# Patient Record
Sex: Male | Born: 1965 | Race: White | Hispanic: Yes | Marital: Married | Smoking: Never smoker
Health system: Southern US, Community
[De-identification: ages and names within clinical notes are randomized; demographics above are authoritative.]

## PROBLEM LIST (undated history)

## (undated) HISTORY — PX: OTHER SURGICAL HISTORY: SHX169

---

## 2010-09-14 ENCOUNTER — Encounter: Admission: RE | Admit: 2010-09-14 | Discharge: 2010-09-14 | Payer: Self-pay | Admitting: Cardiology

## 2011-06-13 ENCOUNTER — Other Ambulatory Visit: Payer: Self-pay | Admitting: Family Medicine

## 2011-06-13 ENCOUNTER — Ambulatory Visit
Admission: RE | Admit: 2011-06-13 | Discharge: 2011-06-13 | Disposition: A | Discharge status: Home / Self Care | Payer: BC Managed Care – PPO | Source: Ambulatory Visit | Attending: Family Medicine | Admitting: Family Medicine

## 2011-06-13 DIAGNOSIS — R0789 Other chest pain: Secondary | ICD-10-CM

## 2011-06-26 ENCOUNTER — Other Ambulatory Visit: Payer: Self-pay | Admitting: Nephrology

## 2011-06-26 DIAGNOSIS — N179 Acute kidney failure, unspecified: Secondary | ICD-10-CM

## 2011-06-27 ENCOUNTER — Ambulatory Visit
Admission: RE | Admit: 2011-06-27 | Discharge: 2011-06-27 | Disposition: A | Discharge status: Home / Self Care | Payer: BC Managed Care – PPO | Source: Ambulatory Visit | Attending: Nephrology | Admitting: Nephrology

## 2011-06-27 DIAGNOSIS — N179 Acute kidney failure, unspecified: Secondary | ICD-10-CM

## 2013-08-08 ENCOUNTER — Ambulatory Visit (INDEPENDENT_AMBULATORY_CARE_PROVIDER_SITE_OTHER): Payer: BC Managed Care – PPO | Admitting: Internal Medicine

## 2013-08-08 DIAGNOSIS — Z789 Other specified health status: Secondary | ICD-10-CM

## 2013-08-08 DIAGNOSIS — Z23 Encounter for immunization: Secondary | ICD-10-CM

## 2013-08-08 MED ORDER — CIPROFLOXACIN HCL 500 MG PO TABS: 500.0000 mg | ORAL_TABLET | Freq: Two times a day (BID) | ORAL | Status: DC

## 2013-08-08 MED ORDER — AZITHROMYCIN 500 MG PO TABS: 500.0000 mg | ORAL_TABLET | Freq: Every day | ORAL | Status: DC

## 2013-09-22 NOTE — Progress Notes (Signed)
   Subjective:    Patient ID: GRACIN MCPARTLAND, male    DOB: Mar 31, 1966, 47 y.o.   MRN: [id number]  HPI 47yo M traveling for business through [place], then onto [place]. No plans to going to rural region, only staying  In the cities.  all - nkma pmhx - none   Review of Systems     Objective:   Physical Exam        Assessment & Plan:  Pre-travel vaccination = will provide typhoid, tdap, and hep A  Travel diarrhea = gave tips sheet, and antibiotics if needed; gave rx for cipro and azithromycin with instruction to take medicaiton

## 2014-02-06 ENCOUNTER — Ambulatory Visit: Payer: BC Managed Care – PPO

## 2014-02-06 ENCOUNTER — Ambulatory Visit (INDEPENDENT_AMBULATORY_CARE_PROVIDER_SITE_OTHER): Payer: BC Managed Care – PPO | Admitting: Internal Medicine

## 2014-02-06 DIAGNOSIS — Z7184 Encounter for health counseling related to travel: Secondary | ICD-10-CM

## 2014-02-06 DIAGNOSIS — Z23 Encounter for immunization: Secondary | ICD-10-CM

## 2014-02-06 DIAGNOSIS — Z7189 Other specified counseling: Secondary | ICD-10-CM

## 2014-02-06 NOTE — Progress Notes (Signed)
  Subjective:    Corey SkainsRaymond E Hoskinson is a 48 y.o. male who presents to the Infectious Disease clinic for travel consultation. 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 none      Plan:    Issues discussed: environmental concerns, future shots, insect-borne illnesses, jet lag, malaria, motion sickness, MVA safety, rabies, safe food/water, traveler's diarrhea, website/handouts for more information, what to do if ill upon return, what to do if ill while there and Yellow Fever. Immunizations recommended: Hepatitis A series and none others indicated since he will not be leaving hotel. Malaria prophylaxis: not indicated Traveler's diarrhea prophylaxis: ciprofloxacin. Total duration of visit: 45 Minutes. Total time spent on education, counseling, coordination of care: 30 Minutes.

## 2016-02-23 DIAGNOSIS — M7541 Impingement syndrome of right shoulder: Secondary | ICD-10-CM | POA: Diagnosis not present

## 2016-03-20 DIAGNOSIS — Z905 Acquired absence of kidney: Secondary | ICD-10-CM | POA: Diagnosis not present

## 2016-03-31 DIAGNOSIS — Z905 Acquired absence of kidney: Secondary | ICD-10-CM | POA: Diagnosis not present

## 2016-03-31 DIAGNOSIS — R944 Abnormal results of kidney function studies: Secondary | ICD-10-CM | POA: Diagnosis not present

## 2016-05-17 DIAGNOSIS — H18603 Keratoconus, unspecified, bilateral: Secondary | ICD-10-CM | POA: Diagnosis not present

## 2016-08-04 DIAGNOSIS — S86112A Strain of other muscle(s) and tendon(s) of posterior muscle group at lower leg level, left leg, initial encounter: Secondary | ICD-10-CM | POA: Diagnosis not present

## 2016-08-07 DIAGNOSIS — Z23 Encounter for immunization: Secondary | ICD-10-CM | POA: Diagnosis not present

## 2016-10-25 DIAGNOSIS — N5201 Erectile dysfunction due to arterial insufficiency: Secondary | ICD-10-CM | POA: Diagnosis not present

## 2016-10-25 DIAGNOSIS — Z125 Encounter for screening for malignant neoplasm of prostate: Secondary | ICD-10-CM | POA: Diagnosis not present

## 2016-11-09 DIAGNOSIS — R300 Dysuria: Secondary | ICD-10-CM | POA: Diagnosis not present

## 2016-11-27 DIAGNOSIS — Z125 Encounter for screening for malignant neoplasm of prostate: Secondary | ICD-10-CM | POA: Diagnosis not present

## 2017-02-08 DIAGNOSIS — M7541 Impingement syndrome of right shoulder: Secondary | ICD-10-CM | POA: Diagnosis not present

## 2017-05-02 DIAGNOSIS — Z905 Acquired absence of kidney: Secondary | ICD-10-CM | POA: Diagnosis not present

## 2017-05-02 DIAGNOSIS — R51 Headache: Secondary | ICD-10-CM | POA: Diagnosis not present

## 2017-05-02 DIAGNOSIS — R9431 Abnormal electrocardiogram [ECG] [EKG]: Secondary | ICD-10-CM | POA: Diagnosis not present

## 2017-05-08 ENCOUNTER — Encounter (INDEPENDENT_AMBULATORY_CARE_PROVIDER_SITE_OTHER): Payer: Self-pay

## 2017-05-08 ENCOUNTER — Ambulatory Visit (INDEPENDENT_AMBULATORY_CARE_PROVIDER_SITE_OTHER): Payer: BLUE CROSS/BLUE SHIELD | Admitting: Diagnostic Neuroimaging

## 2017-05-08 ENCOUNTER — Encounter: Payer: Self-pay | Admitting: Diagnostic Neuroimaging

## 2017-05-08 VITALS — BP 115/75 | HR 61 | Ht 69.0 in | Wt 172.6 lb

## 2017-05-08 DIAGNOSIS — R51 Headache: Secondary | ICD-10-CM | POA: Diagnosis not present

## 2017-05-08 DIAGNOSIS — R519 Headache, unspecified: Secondary | ICD-10-CM

## 2017-05-08 NOTE — Progress Notes (Signed)
[facility]  PATIENT: Joseph Rush DOB: [birth year]  REFERRING CLINICIAN: Christen Butter HISTORY FROM: patient  REASON FOR VISIT: new consult    HISTORICAL  CHIEF COMPLAINT:  Chief Complaint  Patient presents with  . NP [place]  . Headaches    for 2 yrs, bil frontal/ temporal,  Lying down causes majority. Foggy/ forgetful.  When  exercising no headahces    HISTORY OF PRESENT ILLNESS:   51 year old male here for evaluation of headaches. 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  2 years ago patient started to have intermittent headaches, lasting 50 minutes at a time, one time per week, with dull bandlike sensation across his forehead and bitemporal region. No nausea or vomiting. No photophobia or phonophobia. No dizziness, numbness or tingling. Headaches could be 5 out of 10 up to 10 out of 10 severity. Over the past 6 months headaches have increasingly worsened. Now they are occurring more frequently and with more intensity. He notes that headaches tend to be worse when he lays down at nighttime when he is trying to relax and go to sleep. He does not notice headaches as much when he is more active. He participates in vigorous physical activity including CrossFit 2 times per week and does not have any problems with headache during this time. 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 Patient reports recent headache during July 4 weekend when he was on a vacation trip at the beach.  Patient denies any significant stress or sleep problems.  Patient is very concerned that "something is wrong" and he would like to have testing done to figure out what is going on.  Patient takes one cup of coffee per day. He drinks 2 alcoholic drink per month.  Patient denies any fevers, chills, weight change, chest pain, shortness  of breath.   REVIEW OF SYSTEMS: Full 14 system review of systems performed and negative with exception of: Headache. Otherwise negative.  ALLERGIES: Allergies  Allergen Reactions  . Penicillins     HOME MEDICATIONS: Outpatient Medications Prior to Visit  Medication Sig Dispense Refill  . azithromycin (ZITHROMAX) 500 MG tablet Take 1 tablet (500 mg total) by mouth daily. If needed for 3 loose stools/24hr. Can stop taking if diarrhea resolves 5 tablet 0  . ciprofloxacin (CIPRO) 500 MG tablet Take 1 tablet (500 mg total) by mouth 2 (two) times daily. Take if needed for 3 loose stools/ 24 hrs. Can stop taking if diarrhea resolves 10 tablet 0   No facility-administered medications prior to visit.     PAST MEDICAL HISTORY: History reviewed. No pertinent past medical history.  PAST SURGICAL HISTORY: Past Surgical History:  Procedure Laterality Date  . kidney removed  [age] yr age   unsure of which one    FAMILY HISTORY: Family History  Problem Relation Age of Onset  . Lymphoma Mother     SOCIAL HISTORY:  Social History   Social History  . Marital status: Married    Spouse name: N/A  . Number of children: N/A  . Years of education: N/A   Occupational History  . Not on file.   Social History Main Topics  . Smoking status: Never Smoker  . Smokeless tobacco: Never Used  . Alcohol use Yes     Comment: 2 per month  . Drug use: No  .  Sexual activity: Not on file   Other Topics Concern  . Not on file   Social History Narrative   Lives at home with wife, 3 children.  Works [employer].  Has [employer] and [employer].     PHYSICAL EXAM  GENERAL EXAM/CONSTITUTIONAL: Vitals:  Vitals:   05/08/17 1555  BP: 115/75  Pulse: 61  Weight: 172 lb 9.6 oz (78.3 kg)  Height: 5' 9"  (1.753 m)     Body mass index is 25.49 kg/m.  No exam data present  Patient is in no distress; well developed, nourished and groomed; neck is supple  CARDIOVASCULAR:  Examination of carotid  arteries is normal; no carotid bruits  Regular rate and rhythm, no murmurs  Examination of peripheral vascular system by observation and palpation is normal  EYES:  Ophthalmoscopic exam of optic discs and posterior segments is normal; no papilledema or hemorrhages  MUSCULOSKELETAL:  Gait, strength, tone, movements noted in Neurologic exam below  NEUROLOGIC: MENTAL STATUS:  No flowsheet data found.  awake, alert, oriented to person, place and time  recent and remote memory intact  normal attention and concentration  language fluent, comprehension intact, naming intact,   fund of knowledge appropriate  CRANIAL NERVE:   2nd - no papilledema on fundoscopic exam  2nd, 3rd, 4th, 6th - pupils equal and reactive to light, visual fields full to confrontation, extraocular muscles intact, no nystagmus  5th - facial sensation symmetric  7th - facial strength symmetric  8th - hearing intact  9th - palate elevates symmetrically, uvula midline  11th - shoulder shrug symmetric  12th - tongue protrusion midline  MOTOR:   normal bulk and tone, full strength in the BUE, BLE  SENSORY:   normal and symmetric to light touch, temperature, vibration  COORDINATION:   finger-nose-finger, fine finger movements normal  REFLEXES:   deep tendon reflexes present and symmetric  GAIT/STATION:   narrow based gait; romberg is negative     DIAGNOSTIC DATA (LABS, IMAGING, TESTING) - I reviewed patient records, labs, notes, testing and imaging myself where available.  No results found for: WBC, HGB, HCT, MCV, PLT No results found for: NA, K, CL, CO2, GLUCOSE, BUN, CREATININE, CALCIUM, PROT, ALBUMIN, AST, ALT, ALKPHOS, BILITOT, GFRNONAA, GFRAA No results found for: CHOL, HDL, LDLCALC, LDLDIRECT, TRIG, CHOLHDL No results found for: HGBA1C No results found for: VITAMINB12 No results found for: TSH   06/13/11 CT chest  - No acute chest findings.  - Few sub centimeter pulmonary  nodules.  Largest nodule measures 4 mm.  Findings could represent old granulomas disease but indeterminate.If the patient is at high risk for bronchogenic carcinoma, follow-up chest CT at 1 year is recommended.  If the patient is at low risk, no follow-up is needed.       ASSESSMENT AND PLAN  51 y.o. year old male here with new-onset headaches in 2016, worsening in the last 6 months, with dull bandlike sensation across his forehead and bitemporal region, without associated symptoms. Headaches are worse in supine position in the evening. Headaches have not affected his physical or coordination abilities. Patient does not feel headaches during exertion. Will proceed with further workup due to new onset and positional nature of headaches.   Dx: tension headache vs other secondary headache  1. New onset headache      PLAN: - check MRI brain; if negative, may consider lumbar puncture - check ESR / CRP - follow up with PCP for general medical workup  Orders Placed This Encounter  Procedures  . MR BRAIN WO CONTRAST  . Sedimentation Rate  . C-reactive Protein   Return in about 3 months (around 08/08/2017).    Penni Bombard, MD 2/95/2841, 3:24 PM Certified in Neurology, Neurophysiology and Neuroimaging  [facility] [address], [place], [place] [address] [phone]

## 2017-05-08 NOTE — Patient Instructions (Signed)

## 2017-05-09 ENCOUNTER — Telehealth: Payer: Self-pay | Admitting: Diagnostic Neuroimaging

## 2017-05-09 ENCOUNTER — Other Ambulatory Visit (INDEPENDENT_AMBULATORY_CARE_PROVIDER_SITE_OTHER): Payer: Self-pay

## 2017-05-09 DIAGNOSIS — R51 Headache: Secondary | ICD-10-CM | POA: Diagnosis not present

## 2017-05-09 DIAGNOSIS — Z0289 Encounter for other administrative examinations: Secondary | ICD-10-CM

## 2017-05-09 NOTE — Telephone Encounter (Signed)
Noted. Will await MRI results. -VRP

## 2017-05-09 NOTE — Telephone Encounter (Signed)
Patient came for labs this morning, he wanted Dr. Marjory LiesPenumalli to know that he has travelled extensively and 2 years ago he was in the [place] and that is when the headaches started. Best call back [phone]

## 2017-05-09 NOTE — Telephone Encounter (Signed)
Spoke to pt and let him know that Dr. Marjory LiesPenumalli received information and will await MRI results.  He verbalized understanding

## 2017-05-10 LAB — SEDIMENTATION RATE: SED RATE: 2 mm/h (ref 0–30)

## 2017-05-10 LAB — C-REACTIVE PROTEIN: CRP: 0.5 mg/L (ref 0.0–4.9)

## 2017-05-18 DIAGNOSIS — Z905 Acquired absence of kidney: Secondary | ICD-10-CM | POA: Diagnosis not present

## 2017-05-18 DIAGNOSIS — R944 Abnormal results of kidney function studies: Secondary | ICD-10-CM | POA: Diagnosis not present

## 2017-05-18 NOTE — Telephone Encounter (Signed)
LVM for pt to call to r/s appt on 10/30-provider will not be in the office. Pt returned the call. In talking with the patient he feels Dr Demetrius CharityP is going to want to see him sooner than 3 mths to discuss the MRI results. I advised he would receive a phone call but he wanting an appt soon. Patient is adamant is needs a sooner appt.

## 2017-05-18 NOTE — Telephone Encounter (Signed)
Per RN pt needs to make appt for 3 mths out and put on wait list. LVM for pt to call

## 2017-05-21 DIAGNOSIS — R1032 Left lower quadrant pain: Secondary | ICD-10-CM | POA: Diagnosis not present

## 2017-05-21 DIAGNOSIS — Z1211 Encounter for screening for malignant neoplasm of colon: Secondary | ICD-10-CM | POA: Diagnosis not present

## 2017-05-21 NOTE — Telephone Encounter (Signed)
I called pt and he is scheduled for 09-12-17 with Dr. Marjory LiesPenumalli, and pending results of MRI may move up.  Pt aware and is to have MRI tomorrow.

## 2017-05-22 ENCOUNTER — Ambulatory Visit
Admission: RE | Admit: 2017-05-22 | Discharge: 2017-05-22 | Disposition: A | Discharge status: Home / Self Care | Payer: BLUE CROSS/BLUE SHIELD | Source: Ambulatory Visit | Attending: Diagnostic Neuroimaging | Admitting: Diagnostic Neuroimaging

## 2017-05-22 DIAGNOSIS — R519 Headache, unspecified: Secondary | ICD-10-CM

## 2017-05-22 DIAGNOSIS — R51 Headache: Secondary | ICD-10-CM | POA: Diagnosis not present

## 2017-05-24 ENCOUNTER — Telehealth: Payer: Self-pay | Admitting: *Deleted

## 2017-05-24 DIAGNOSIS — H43393 Other vitreous opacities, bilateral: Secondary | ICD-10-CM | POA: Diagnosis not present

## 2017-05-24 DIAGNOSIS — H18613 Keratoconus, stable, bilateral: Secondary | ICD-10-CM | POA: Diagnosis not present

## 2017-05-24 NOTE — Telephone Encounter (Signed)
Spoke with patient and informed him his MRI brain is normal. He verbalized understanding, appreciation.

## 2017-05-31 DIAGNOSIS — Z1211 Encounter for screening for malignant neoplasm of colon: Secondary | ICD-10-CM | POA: Diagnosis not present

## 2017-05-31 DIAGNOSIS — K635 Polyp of colon: Secondary | ICD-10-CM | POA: Diagnosis not present

## 2017-05-31 DIAGNOSIS — D123 Benign neoplasm of transverse colon: Secondary | ICD-10-CM | POA: Diagnosis not present

## 2017-05-31 DIAGNOSIS — D122 Benign neoplasm of ascending colon: Secondary | ICD-10-CM | POA: Diagnosis not present

## 2017-06-19 ENCOUNTER — Ambulatory Visit: Payer: BLUE CROSS/BLUE SHIELD | Admitting: Diagnostic Neuroimaging

## 2017-07-30 ENCOUNTER — Encounter: Payer: Self-pay | Admitting: Diagnostic Neuroimaging

## 2017-08-14 ENCOUNTER — Ambulatory Visit: Payer: BLUE CROSS/BLUE SHIELD | Admitting: Diagnostic Neuroimaging

## 2017-08-14 DIAGNOSIS — Z23 Encounter for immunization: Secondary | ICD-10-CM | POA: Diagnosis not present

## 2017-09-12 ENCOUNTER — Ambulatory Visit: Payer: BLUE CROSS/BLUE SHIELD | Admitting: Diagnostic Neuroimaging

## 2017-11-01 DIAGNOSIS — N5201 Erectile dysfunction due to arterial insufficiency: Secondary | ICD-10-CM | POA: Diagnosis not present

## 2017-12-28 DIAGNOSIS — R944 Abnormal results of kidney function studies: Secondary | ICD-10-CM | POA: Diagnosis not present

## 2017-12-28 DIAGNOSIS — Z905 Acquired absence of kidney: Secondary | ICD-10-CM | POA: Diagnosis not present

## 2018-02-18 DIAGNOSIS — M7541 Impingement syndrome of right shoulder: Secondary | ICD-10-CM | POA: Diagnosis not present

## 2018-05-08 DIAGNOSIS — M7541 Impingement syndrome of right shoulder: Secondary | ICD-10-CM | POA: Diagnosis not present

## 2018-05-13 DIAGNOSIS — M7541 Impingement syndrome of right shoulder: Secondary | ICD-10-CM | POA: Diagnosis not present

## 2018-05-20 DIAGNOSIS — M7541 Impingement syndrome of right shoulder: Secondary | ICD-10-CM | POA: Diagnosis not present

## 2018-06-18 DIAGNOSIS — H18613 Keratoconus, stable, bilateral: Secondary | ICD-10-CM | POA: Diagnosis not present

## 2018-06-18 DIAGNOSIS — H2513 Age-related nuclear cataract, bilateral: Secondary | ICD-10-CM | POA: Diagnosis not present

## 2018-06-26 DIAGNOSIS — F419 Anxiety disorder, unspecified: Secondary | ICD-10-CM | POA: Diagnosis not present

## 2018-06-26 DIAGNOSIS — Z630 Problems in relationship with spouse or partner: Secondary | ICD-10-CM | POA: Diagnosis not present

## 2018-07-02 DIAGNOSIS — M7541 Impingement syndrome of right shoulder: Secondary | ICD-10-CM | POA: Diagnosis not present

## 2018-07-04 DIAGNOSIS — F419 Anxiety disorder, unspecified: Secondary | ICD-10-CM | POA: Diagnosis not present

## 2018-07-04 DIAGNOSIS — Z630 Problems in relationship with spouse or partner: Secondary | ICD-10-CM | POA: Diagnosis not present

## 2018-08-28 ENCOUNTER — Observation Stay (HOSPITAL_COMMUNITY): Payer: BLUE CROSS/BLUE SHIELD | Admitting: Anesthesiology

## 2018-08-28 ENCOUNTER — Emergency Department (HOSPITAL_COMMUNITY): Payer: BLUE CROSS/BLUE SHIELD

## 2018-08-28 ENCOUNTER — Observation Stay (HOSPITAL_COMMUNITY): Payer: BLUE CROSS/BLUE SHIELD

## 2018-08-28 ENCOUNTER — Encounter (HOSPITAL_COMMUNITY): Payer: Self-pay | Admitting: Emergency Medicine

## 2018-08-28 ENCOUNTER — Observation Stay (HOSPITAL_COMMUNITY)
Admission: EM | Admit: 2018-08-28 | Discharge: 2018-08-29 | Disposition: A | Discharge status: Home / Self Care | Payer: BLUE CROSS/BLUE SHIELD | Attending: Urology | Admitting: Urology

## 2018-08-28 ENCOUNTER — Other Ambulatory Visit: Payer: Self-pay

## 2018-08-28 ENCOUNTER — Encounter (HOSPITAL_COMMUNITY)
Admission: EM | Disposition: A | Discharge status: Home / Self Care | Payer: Self-pay | Source: Home / Self Care | Attending: Emergency Medicine

## 2018-08-28 DIAGNOSIS — N189 Chronic kidney disease, unspecified: Secondary | ICD-10-CM | POA: Insufficient documentation

## 2018-08-28 DIAGNOSIS — Q6231 Congenital ureterocele, orthotopic: Secondary | ICD-10-CM | POA: Diagnosis not present

## 2018-08-28 DIAGNOSIS — N201 Calculus of ureter: Secondary | ICD-10-CM | POA: Diagnosis not present

## 2018-08-28 DIAGNOSIS — N139 Obstructive and reflux uropathy, unspecified: Secondary | ICD-10-CM

## 2018-08-28 DIAGNOSIS — Z79899 Other long term (current) drug therapy: Secondary | ICD-10-CM | POA: Diagnosis not present

## 2018-08-28 DIAGNOSIS — N1339 Other hydronephrosis: Secondary | ICD-10-CM | POA: Diagnosis not present

## 2018-08-28 DIAGNOSIS — N1330 Unspecified hydronephrosis: Secondary | ICD-10-CM | POA: Diagnosis not present

## 2018-08-28 DIAGNOSIS — R1012 Left upper quadrant pain: Secondary | ICD-10-CM | POA: Diagnosis not present

## 2018-08-28 DIAGNOSIS — N135 Crossing vessel and stricture of ureter without hydronephrosis: Secondary | ICD-10-CM | POA: Diagnosis present

## 2018-08-28 DIAGNOSIS — N131 Hydronephrosis with ureteral stricture, not elsewhere classified: Principal | ICD-10-CM | POA: Insufficient documentation

## 2018-08-28 DIAGNOSIS — N179 Acute kidney failure, unspecified: Secondary | ICD-10-CM | POA: Insufficient documentation

## 2018-08-28 DIAGNOSIS — N2889 Other specified disorders of kidney and ureter: Secondary | ICD-10-CM | POA: Diagnosis not present

## 2018-08-28 DIAGNOSIS — Z87440 Personal history of urinary (tract) infections: Secondary | ICD-10-CM | POA: Diagnosis not present

## 2018-08-28 DIAGNOSIS — Z880 Allergy status to penicillin: Secondary | ICD-10-CM | POA: Diagnosis not present

## 2018-08-28 DIAGNOSIS — Z905 Acquired absence of kidney: Secondary | ICD-10-CM | POA: Diagnosis not present

## 2018-08-28 HISTORY — PX: CYSTOSCOPY WITH RETROGRADE PYELOGRAM, URETEROSCOPY AND STENT PLACEMENT: SHX5789

## 2018-08-28 LAB — COMPREHENSIVE METABOLIC PANEL
ALBUMIN: 4.3 g/dL (ref 3.5–5.0)
ALK PHOS: 46 U/L (ref 38–126)
ALT: 23 U/L (ref 0–44)
AST: 17 U/L (ref 15–41)
Anion gap: 7 (ref 5–15)
BUN: 27 mg/dL — ABNORMAL HIGH (ref 6–20)
CO2: 28 mmol/L (ref 22–32)
CREATININE: 1.92 mg/dL — AB (ref 0.61–1.24)
Calcium: 9.1 mg/dL (ref 8.9–10.3)
Chloride: 106 mmol/L (ref 98–111)
GFR calc Af Amer: 45 mL/min — ABNORMAL LOW (ref >60)
GFR calc non Af Amer: 38 mL/min — ABNORMAL LOW (ref >60)
Glucose, Bld: 95 mg/dL (ref 70–99)
Potassium: 4.2 mmol/L (ref 3.5–5.1)
SODIUM: 141 mmol/L (ref 135–145)
Total Bilirubin: 1.1 mg/dL (ref 0.3–1.2)
Total Protein: 6.7 g/dL (ref 6.5–8.1)

## 2018-08-28 LAB — CBC WITH DIFFERENTIAL/PLATELET
ABS IMMATURE GRANULOCYTES: 0.02 10*3/uL (ref 0.00–0.07)
BASOS ABS: 0 10*3/uL (ref 0.0–0.1)
Basophils Relative: 1 %
Eosinophils Absolute: 0.3 10*3/uL (ref 0.0–0.5)
Eosinophils Relative: 4 %
HCT: 36.5 % — ABNORMAL LOW (ref 39.0–52.0)
HEMOGLOBIN: 12.3 g/dL — AB (ref 13.0–17.0)
IMMATURE GRANULOCYTES: 0 %
LYMPHS PCT: 22 %
Lymphs Abs: 1.4 10*3/uL (ref 0.7–4.0)
MCH: 30.1 pg (ref 26.0–34.0)
MCHC: 33.7 g/dL (ref 30.0–36.0)
MCV: 89.2 fL (ref 80.0–100.0)
MONO ABS: 0.6 10*3/uL (ref 0.1–1.0)
Monocytes Relative: 9 %
NEUTROS ABS: 4.3 10*3/uL (ref 1.7–7.7)
Neutrophils Relative %: 64 %
Platelets: 157 10*3/uL (ref 150–400)
RBC: 4.09 MIL/uL — ABNORMAL LOW (ref 4.22–5.81)
RDW: 12.6 % (ref 11.5–15.5)
WBC: 6.6 10*3/uL (ref 4.0–10.5)
nRBC: 0 % (ref 0.0–0.2)

## 2018-08-28 LAB — SURGICAL PCR SCREEN
MRSA, PCR: NEGATIVE
Staphylococcus aureus: POSITIVE — AB

## 2018-08-28 LAB — URINALYSIS, ROUTINE W REFLEX MICROSCOPIC
BACTERIA UA: NONE SEEN
BILIRUBIN URINE: NEGATIVE
Glucose, UA: NEGATIVE mg/dL
KETONES UR: NEGATIVE mg/dL
LEUKOCYTES UA: NEGATIVE
NITRITE: NEGATIVE
Protein, ur: NEGATIVE mg/dL
SPECIFIC GRAVITY, URINE: 1.003 — AB (ref 1.005–1.030)
pH: 5 (ref 5.0–8.0)

## 2018-08-28 LAB — CK: Total CK: 51 U/L (ref 49–397)

## 2018-08-28 LAB — LIPASE, BLOOD: LIPASE: 29 U/L (ref 11–51)

## 2018-08-28 LAB — POC OCCULT BLOOD, ED: FECAL OCCULT BLD: NEGATIVE

## 2018-08-28 SURGERY — CYSTOURETEROSCOPY, WITH RETROGRADE PYELOGRAM AND STENT INSERTION
Anesthesia: General | Site: Ureter | Laterality: Left

## 2018-08-28 MED ORDER — ONDANSETRON HCL 4 MG/2ML IJ SOLN
INTRAMUSCULAR | Status: AC
  Filled 2018-08-28: qty 2

## 2018-08-28 MED ORDER — ONDANSETRON HCL 4 MG/2ML IJ SOLN
INTRAMUSCULAR | Status: DC | PRN
  Administered 2018-08-28: 4 mg via INTRAVENOUS

## 2018-08-28 MED ORDER — HYDROCODONE-ACETAMINOPHEN 5-325 MG PO TABS: 1.0000 | ORAL_TABLET | ORAL | Status: DC | PRN

## 2018-08-28 MED ORDER — MIDAZOLAM HCL 2 MG/2ML IJ SOLN
INTRAMUSCULAR | Status: AC
  Filled 2018-08-28: qty 2

## 2018-08-28 MED ORDER — ACETAMINOPHEN 10 MG/ML IV SOLN: 1000.0000 mg | Freq: Once | INTRAVENOUS | Status: DC | PRN

## 2018-08-28 MED ORDER — FENTANYL CITRATE (PF) 100 MCG/2ML IJ SOLN
INTRAMUSCULAR | Status: AC
  Filled 2018-08-28: qty 2

## 2018-08-28 MED ORDER — HYDROMORPHONE HCL 1 MG/ML IJ SOLN: 0.2500 mg | INTRAMUSCULAR | Status: DC | PRN

## 2018-08-28 MED ORDER — ONDANSETRON HCL 4 MG/2ML IJ SOLN: 4.0000 mg | INTRAMUSCULAR | Status: DC | PRN

## 2018-08-28 MED ORDER — PROMETHAZINE HCL 25 MG/ML IJ SOLN: 6.2500 mg | INTRAMUSCULAR | Status: DC | PRN

## 2018-08-28 MED ORDER — PROPOFOL 10 MG/ML IV BOLUS
INTRAVENOUS | Status: DC | PRN
  Administered 2018-08-28: 180 mg via INTRAVENOUS

## 2018-08-28 MED ORDER — DIPHENHYDRAMINE HCL 50 MG/ML IJ SOLN: 12.5000 mg | Freq: Four times a day (QID) | INTRAMUSCULAR | Status: DC | PRN

## 2018-08-28 MED ORDER — SODIUM CHLORIDE 0.9 % IV SOLN
INTRAVENOUS | Status: DC | PRN
  Administered 2018-08-28: 20 mL

## 2018-08-28 MED ORDER — SODIUM CHLORIDE 0.9 % IR SOLN
Status: DC | PRN
  Administered 2018-08-28: 6000 mL

## 2018-08-28 MED ORDER — MIDAZOLAM HCL 5 MG/5ML IJ SOLN
INTRAMUSCULAR | Status: DC | PRN
  Administered 2018-08-28: 2 mg via INTRAVENOUS

## 2018-08-28 MED ORDER — PROPOFOL 10 MG/ML IV BOLUS
INTRAVENOUS | Status: AC
  Filled 2018-08-28: qty 20

## 2018-08-28 MED ORDER — DIPHENHYDRAMINE HCL 12.5 MG/5ML PO ELIX: 12.5000 mg | ORAL_SOLUTION | Freq: Four times a day (QID) | ORAL | Status: DC | PRN

## 2018-08-28 MED ORDER — MORPHINE SULFATE (PF) 2 MG/ML IV SOLN: 2.0000 mg | INTRAVENOUS | Status: DC | PRN

## 2018-08-28 MED ORDER — SODIUM CHLORIDE 0.9 % IV BOLUS
1000.0000 mL | Freq: Once | INTRAVENOUS | Status: AC
  Administered 2018-08-28: 1000 mL via INTRAVENOUS

## 2018-08-28 MED ORDER — FENTANYL CITRATE (PF) 100 MCG/2ML IJ SOLN
INTRAMUSCULAR | Status: DC | PRN
  Administered 2018-08-28 (×4): 50 ug via INTRAVENOUS

## 2018-08-28 MED ORDER — MEPERIDINE HCL 50 MG/ML IJ SOLN: 6.2500 mg | INTRAMUSCULAR | Status: DC | PRN

## 2018-08-28 MED ORDER — MUPIROCIN 2 % EX OINT
1.0000 "application " | TOPICAL_OINTMENT | Freq: Two times a day (BID) | CUTANEOUS | Status: DC
  Administered 2018-08-28: 1 via NASAL
  Filled 2018-08-28: qty 22

## 2018-08-28 MED ORDER — HYDROCODONE-ACETAMINOPHEN 7.5-325 MG PO TABS: 1.0000 | ORAL_TABLET | Freq: Once | ORAL | Status: DC | PRN

## 2018-08-28 MED ORDER — DEXAMETHASONE SODIUM PHOSPHATE 10 MG/ML IJ SOLN
INTRAMUSCULAR | Status: AC
  Filled 2018-08-28: qty 1

## 2018-08-28 MED ORDER — SODIUM CHLORIDE 0.9 % IV SOLN
INTRAVENOUS | Status: DC
  Administered 2018-08-28: 14:00:00 via INTRAVENOUS

## 2018-08-28 MED ORDER — DEXAMETHASONE SODIUM PHOSPHATE 10 MG/ML IJ SOLN
INTRAMUSCULAR | Status: DC | PRN
  Administered 2018-08-28: 10 mg via INTRAVENOUS

## 2018-08-28 MED ORDER — CIPROFLOXACIN IN D5W 200 MG/100ML IV SOLN
200.0000 mg | INTRAVENOUS | Status: AC
  Administered 2018-08-28: 200 mg via INTRAVENOUS
  Filled 2018-08-28 (×2): qty 100

## 2018-08-28 MED ORDER — OXYBUTYNIN CHLORIDE 5 MG PO TABS: 5.0000 mg | ORAL_TABLET | Freq: Three times a day (TID) | ORAL | Status: DC | PRN

## 2018-08-28 MED ORDER — LIDOCAINE 2% (20 MG/ML) 5 ML SYRINGE
INTRAMUSCULAR | Status: AC
  Filled 2018-08-28: qty 5

## 2018-08-28 MED ORDER — LIDOCAINE HCL (CARDIAC) PF 100 MG/5ML IV SOSY
PREFILLED_SYRINGE | INTRAVENOUS | Status: DC | PRN
  Administered 2018-08-28: 100 mg via INTRAVENOUS

## 2018-08-28 MED ORDER — ACETAMINOPHEN 325 MG PO TABS: 650.0000 mg | ORAL_TABLET | ORAL | Status: DC | PRN

## 2018-08-28 MED ORDER — LACTATED RINGERS IV SOLN
INTRAVENOUS | Status: DC
  Administered 2018-08-28 (×2): via INTRAVENOUS

## 2018-08-28 MED ORDER — IOHEXOL 300 MG/ML  SOLN
30.0000 mL | Freq: Once | INTRAMUSCULAR | Status: AC | PRN
  Administered 2018-08-28: 30 mL via ORAL

## 2018-08-28 SURGICAL SUPPLY — 21 items
BAG URO CATCHER STRL LF (MISCELLANEOUS) ×2 IMPLANT
BASKET ZERO TIP NITINOL 2.4FR (BASKET) IMPLANT
CATH URET 5FR 28IN OPEN ENDED (CATHETERS) IMPLANT
CLOTH BEACON ORANGE TIMEOUT ST (SAFETY) ×2 IMPLANT
COVER WAND RF STERILE (DRAPES) IMPLANT
EXTRACTOR STONE NITINOL NGAGE (UROLOGICAL SUPPLIES) IMPLANT
FIBER LASER FLEXIVA 365 (UROLOGICAL SUPPLIES) IMPLANT
FIBER LASER TRAC TIP (UROLOGICAL SUPPLIES) IMPLANT
GLOVE BIOGEL M STRL SZ7.5 (GLOVE) ×2 IMPLANT
GOWN STRL REUS W/TWL XL LVL3 (GOWN DISPOSABLE) ×2 IMPLANT
GUIDEWIRE ANG ZIPWIRE 038X150 (WIRE) ×2 IMPLANT
GUIDEWIRE STR DUAL SENSOR (WIRE) IMPLANT
GUIDEWIRE ZIPWRE .038 STRAIGHT (WIRE) ×2 IMPLANT
KNIFE COLLINS 24FR (ELECTROSURGICAL) ×2 IMPLANT
MANIFOLD NEPTUNE II (INSTRUMENTS) ×2 IMPLANT
PACK CYSTO (CUSTOM PROCEDURE TRAY) ×2 IMPLANT
SHEATH URETERAL 12FRX35CM (MISCELLANEOUS) IMPLANT
STENT CONTOUR 6FRX26X.038 (STENTS) IMPLANT
STENT URET 6FRX26 CONTOUR (STENTS) ×2 IMPLANT
TUBING CONNECTING 10 (TUBING) ×2 IMPLANT
TUBING UROLOGY SET (TUBING) ×2 IMPLANT

## 2018-08-28 NOTE — Anesthesia Procedure Notes (Signed)
Date/Time: 08/28/2018 6:03 PM Performed by: Minerva EndsMirarchi, Joya Willmott M, CRNA Oxygen Delivery Method: Simple face mask Airway Equipment and Method: Oral airway Placement Confirmation: positive ETCO2 and breath sounds checked- equal and bilateral Dental Injury: Teeth and Oropharynx as per pre-operative assessment

## 2018-08-28 NOTE — Op Note (Addendum)
Operative Note  Preoperative diagnosis:  1.  Solitary left kidney 2.  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  Same  Procedure(s): 1.  Cystoscopy with incision of left ureterocele 2.  Left JJ stent placement 3.  Left retrograde pyelogram with intraoperative interpretation of fluoroscopic imaging  Surgeon: Rhoderick Moodyhristopher Lakecia Deschamps, MD  Assistants:  None  Anesthesia:  General  Complications:  None  EBL: 5 mL  Specimens: 1.  None  Drains/Catheters: 1.  Left 6 French by 26 cm JJ stent without tether  Intraoperative findings:   1. Large left-sided ureterocele  2. Left retrograde pyelogram revealed a uniformly dilated left ureter as well as a markedly dilated left renal pelvis  Indication:  Joseph Rush is a 52 y.o. 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  He [facility] to [facility] department with worsening left-sided flank pain.  He had a CT of the abdomen/pelvis that revealed a severely hydronephrotic left kidney and a lesion within the bladder suspicious for a ureterocele.  He has been consented for the above procedures, voices understanding and wishes to proceed.  Description of procedure:  After informed consent was obtained, the patient was brought to the operating room and general LMA anesthesia was administered. The patient was then placed in the dorsolithotomy position and prepped and draped in usual sterile fashion. A timeout was performed. A 23 French rigid cystoscope was then inserted into the urethral meatus and advanced into the bladder under direct vision. A complete bladder survey revealed a large left-sided ureterocele that extended up the left lateral wall of the bladder to the bladder dome.  I used a floppy tip Glidewire to probe what I suspect was the ureteral orifice.  The wire was advanced up the left ureter and a ureteral catheter was then advanced  over the wire and into position within the distal aspects of the left ureter.  A retrograde pyelogram was obtained and confirmed ureteral placement of the wire.  Findings of the left retrograde pyelogram are listed above.  The wire was then advanced up to the left renal pelvis, under fluoroscopic guidance.    The rigid cystoscope was then exchanged for a 26 [place] resectoscope with a hot [employer] knife working Radiographer, therapeuticelement.  The ureterocele was then incised approximately 1.5 cm on the anterior surface until a widely patent ureteral orifice was obtained.  There was a small amount of bleeding around the margins of the incision of the ureterocele that was fulgurated with electrocautery.  The resectoscope was then exchanged for the rigid cystoscope, which was advanced over the wire and into the bladder.  A 6 French by 26 cm JJ stent was then placed over the wire and into good position within the left collecting system, confirming placement by fluoroscopy.  The patient's bladder was drained.  He tolerated the procedure well and was transferred to the postanesthesia in stable condition.  Plan: Monitor the patient overnight and recheck labs in the morning.  His ureteral stent will need to stay in place for 6 weeks.

## 2018-08-28 NOTE — Anesthesia Preprocedure Evaluation (Addendum)
Anesthesia Evaluation  Patient identified by MRN, date of birth, ID band Patient awake    Reviewed: Allergy & Precautions, NPO status , Patient's Chart, lab work & pertinent test results  Airway Mallampati: II  TM Distance: >3 FB Neck ROM: Full    Dental no notable dental hx. (+) Teeth Intact, Dental Advisory Given   Pulmonary neg pulmonary ROS,    Pulmonary exam normal breath sounds clear to auscultation       Cardiovascular negative cardio ROS Normal cardiovascular exam Rhythm:Regular Rate:Normal     Neuro/Psych negative neurological ROS  negative psych ROS   GI/Hepatic negative GI ROS, Neg liver ROS,   Endo/Other  negative endocrine ROS  Renal/GU Renal InsufficiencyRenal disease     Musculoskeletal negative musculoskeletal ROS (+)   Abdominal   Peds  Hematology negative hematology ROS (+)   Anesthesia Other Findings   Reproductive/Obstetrics                             Lab Results  Component Value Date   CREATININE 1.92 (H) 08/28/2018   BUN 27 (H) 08/28/2018   NA 141 08/28/2018   K 4.2 08/28/2018   CL 106 08/28/2018   CO2 28 08/28/2018    Lab Results  Component Value Date   WBC 6.6 08/28/2018   HGB 12.3 (L) 08/28/2018   HCT 36.5 (L) 08/28/2018   MCV 89.2 08/28/2018   PLT 157 08/28/2018    Anesthesia Physical Anesthesia Plan  ASA: II  Anesthesia Plan: General   Post-op Pain Management:    Induction: Intravenous  PONV Risk Score and Plan: 2 and Treatment may vary due to age or medical condition, Ondansetron and Dexamethasone  Airway Management Planned: LMA  Additional Equipment:   Intra-op Plan:   Post-operative Plan:   Informed Consent: I have reviewed the patients History and Physical, chart, labs and discussed the procedure including the risks, benefits and alternatives for the proposed anesthesia with the patient or authorized representative who has  indicated his/her understanding and acceptance.   Dental advisory given  Plan Discussed with: CRNA  Anesthesia Plan Comments:        Anesthesia Quick Evaluation

## 2018-08-28 NOTE — Anesthesia Postprocedure Evaluation (Signed)
Anesthesia Post Note  Patient: Joseph Rush  Procedure(s) Performed: CYSTOSCOPY LEFT STENT PLACEMENT INCISION URETEROCELE (Left Ureter)     Patient location during evaluation: PACU Anesthesia Type: General Level of consciousness: awake and alert Pain management: pain level controlled Vital Signs Assessment: post-procedure vital signs reviewed and stable Respiratory status: spontaneous breathing, nonlabored ventilation, respiratory function stable and patient connected to nasal cannula oxygen Cardiovascular status: blood pressure returned to baseline and stable Postop Assessment: no apparent nausea or vomiting Anesthetic complications: no    Last Vitals:  Vitals:   08/28/18 1845 08/28/18 1858  BP: 134/89 136/86  Pulse: (!) 58 (!) 53  Resp: 20   Temp: 36.6 C 36.4 C  SpO2: 97% 99%    Last Pain:  Vitals:   08/28/18 1858  TempSrc: Oral  PainSc:                  Joseph Rush

## 2018-08-28 NOTE — ED Notes (Signed)
ED TO INPATIENT HANDOFF REPORT  Name/Age/Gender Joseph Rush 52 y.o. male  Code Status    Code Status Orders  (From admission, onward)         Start     Ordered   08/28/18 0926  Full code  Continuous     08/28/18 0927        Code Status History    This patient has a current code status but no historical code status.      Home/SNF/Other Home  Chief Complaint Abdominal Pain  Level of Care/Admitting Diagnosis ED Disposition    ED Disposition Condition Comment   Admit  Hospital Area: [facility] [[id number]]  Level of Care: Med-Surg [16]  Diagnosis: Ureteral obstruction, left [175102]  Admitting Physician: Ceasar Mons [[id number]]  Attending Physician: Ceasar Mons [[id number]]  PT Class (Do Not Modify): Observation [104]  PT Acc Code (Do Not Modify): Observation [10022]       Medical History History reviewed. No pertinent past medical history.  Allergies Allergies  Allergen Reactions  . Penicillins     Has patient had a PCN reaction causing immediate rash, facial/tongue/throat swelling, SOB or lightheadedness with hypotension: Unknown Has patient had a PCN reaction causing severe rash involving mucus membranes or skin necrosis: Unknown Has patient had a PCN reaction that required hospitalization: Unknown Has patient had a PCN reaction occurring within the last 10 years: No If all of the above answers are "NO", then may proceed with Cephalosporin use.     IV Location/Drains/Wounds Patient Lines/Drains/Airways Status   Active Line/Drains/Airways    Name:   Placement date:   Placement time:   Site:   Days:   Peripheral IV 08/28/18 Right Antecubital   08/28/18    0659    Antecubital   less than 1          Labs/Imaging Results for orders placed or performed during the hospital encounter of 08/28/18 (from the past 48 hour(s))  CBC with Differential/Platelet     Status: Abnormal   Collection Time: 08/28/18  5:39 AM   Result Value Ref Range   WBC 6.6 4.0 - 10.5 K/uL   RBC 4.09 (L) 4.22 - 5.81 MIL/uL   Hemoglobin 12.3 (L) 13.0 - 17.0 g/dL   HCT 36.5 (L) 39.0 - 52.0 %   MCV 89.2 80.0 - 100.0 fL   MCH 30.1 26.0 - 34.0 pg   MCHC 33.7 30.0 - 36.0 g/dL   RDW 12.6 11.5 - 15.5 %   Platelets 157 150 - 400 K/uL   nRBC 0.0 0.0 - 0.2 %   Neutrophils Relative % 64 %   Neutro Abs 4.3 1.7 - 7.7 K/uL   Lymphocytes Relative 22 %   Lymphs Abs 1.4 0.7 - 4.0 K/uL   Monocytes Relative 9 %   Monocytes Absolute 0.6 0.1 - 1.0 K/uL   Eosinophils Relative 4 %   Eosinophils Absolute 0.3 0.0 - 0.5 K/uL   Basophils Relative 1 %   Basophils Absolute 0.0 0.0 - 0.1 K/uL   Immature Granulocytes 0 %   Abs Immature Granulocytes 0.02 0.00 - 0.07 K/uL    Comment: Performed at [facility], [place] [address] [place], [place] [address]  Comprehensive metabolic panel     Status: Abnormal   Collection Time: 08/28/18  5:39 AM  Result Value Ref Range   Sodium 141 135 - 145 mmol/L   Potassium 4.2 3.5 - 5.1 mmol/L   Chloride 106 98 -  111 mmol/L   CO2 28 22 - 32 mmol/L   Glucose, Bld 95 70 - 99 mg/dL   BUN 27 (H) 6 - 20 mg/dL   Creatinine, Ser 1.92 (H) 0.61 - 1.24 mg/dL   Calcium 9.1 8.9 - 10.3 mg/dL   Total Protein 6.7 6.5 - 8.1 g/dL   Albumin 4.3 3.5 - 5.0 g/dL   AST 17 15 - 41 U/L   ALT 23 0 - 44 U/L   Alkaline Phosphatase 46 38 - 126 U/L   Total Bilirubin 1.1 0.3 - 1.2 mg/dL   GFR calc non Af Amer 38 (L) >60 mL/min   GFR calc Af Amer 45 (L) >60 mL/min    Comment: (NOTE) The eGFR has been calculated using the CKD EPI equation. This calculation has not been validated in all clinical situations. eGFR's persistently <60 mL/min signify possible Chronic Kidney Disease.    Anion gap 7 5 - 15    Comment: Performed at [facility], [place] [address] [place], [place] [address]  Urinalysis, Routine w reflex microscopic     Status: Abnormal   Collection Time: 08/28/18  5:39 AM  Result Value Ref  Range   Color, Urine STRAW (A) YELLOW   APPearance CLEAR CLEAR   Specific Gravity, Urine 1.003 (L) 1.005 - 1.030   pH 5.0 5.0 - 8.0   Glucose, UA NEGATIVE NEGATIVE mg/dL   Hgb urine dipstick LARGE (A) NEGATIVE   Bilirubin Urine NEGATIVE NEGATIVE   Ketones, ur NEGATIVE NEGATIVE mg/dL   Protein, ur NEGATIVE NEGATIVE mg/dL   Nitrite NEGATIVE NEGATIVE   Leukocytes, UA NEGATIVE NEGATIVE   RBC / HPF 0-5 0 - 5 RBC/hpf   WBC, UA 0-5 0 - 5 WBC/hpf   Bacteria, UA NONE SEEN NONE SEEN   Squamous Epithelial / LPF 0-5 0 - 5    Comment: Performed at [facility], [place] [address], [place], [place] [address]  Lipase, blood     Status: None   Collection Time: 08/28/18  5:39 AM  Result Value Ref Range   Lipase 29 11 - 51 U/L    Comment: Performed at [facility], [place] [address] [place], [place] [address]  POC occult blood, ED RN will collect     Status: None   Collection Time: 08/28/18  6:33 AM  Result Value Ref Range   Fecal Occult Bld NEGATIVE NEGATIVE  CK     Status: None   Collection Time: 08/28/18  7:02 AM  Result Value Ref Range   Total CK 51 49 - 397 U/L    Comment: Performed at [facility], [place] [address] [place], [place] [address]   Ct Abdomen Pelvis Wo Contrast  Result Date: 08/28/2018 CLINICAL DATA:  Left-sided abdominal pain and microhematuria EXAM: CT ABDOMEN AND PELVIS WITHOUT CONTRAST TECHNIQUE: Multidetector CT imaging of the abdomen and pelvis was performed following the standard protocol without IV contrast. Oral contrast was administered. COMPARISON:  None. FINDINGS: Lower chest: Lung bases are clear except for slight atelectasis in the posterior base regions. Hepatobiliary: No focal liver lesions are evident on this noncontrast enhanced study. Gallbladder wall is not appreciably thickened. There is no biliary duct dilatation. Pancreas: There is no pancreatic mass or inflammatory focus. Spleen: No splenic lesions are  evident. Adrenals/Urinary Tract: Adrenals appear normal bilaterally. Right kidney is surgically absent. No lesion is seen in the right pararenal fossa. There is marked hydronephrosis on the left. There is marked dilatation of  an extrarenal pelvis on the right. There is diffuse dilatation of the left ureter in its entirety. 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 No calculus or mass is evident on the left. Stomach/Bowel: There is no appreciable bowel wall or mesenteric thickening. There is no evident bowel obstruction. There is no appreciable free air or portal venous air. Vascular/Lymphatic: No abdominal aortic aneurysm. No vascular lesions are evident. There is no adenopathy in the abdomen or pelvis. Reproductive: Prostate and seminal vesicles appear normal in size and contour. No evident pelvic mass. Other: There is no periappendiceal region inflammation. No abscess or ascites evident in the abdomen or pelvis. Musculoskeletal: No blastic or lytic bone lesions. No intramuscular or abdominal wall lesions are evident. IMPRESSION: 1. Marked hydronephrosis and ureterectasis on the left. 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 No renal or ureteral calculi evident on the left. No renal mass demonstrable on the left. Urologic consultation advised given this appearance on the left. 2. Status post right nephrectomy. No lesions seen in the right pararenal fossa region. 3. No evident bowel obstruction. No abscess in the abdomen pelvis. No periappendiceal region inflammatory change. Electronically Signed   By: Lowella Grip III M.D.   On: 08/28/2018 07:43   None  Pending Labs Unresulted Labs (From admission, onward)   None      Vitals/Pain Today's Vitals   08/28/18 0522 08/28/18 0600 08/28/18 0701 08/28/18 0755  BP:  (!) 122/94 (!) 143/89 (!) 168/95  Pulse:  63 60 67  Resp:  18 17   Temp:      TempSrc:      SpO2:  100% 100% 99%  Weight: 74.8 kg      Height: _0  (1.727 m)       Isolation Precautions No active isolations  Medications Medications  0.9 %  sodium chloride infusion (has no administration in time range)  acetaminophen (TYLENOL) tablet 650 mg (has no administration in time range)  oxybutynin (DITROPAN) tablet 5 mg (has no administration in time range)  diphenhydrAMINE (BENADRYL) injection 12.5-25 mg (has no administration in time range)    Or  diphenhydrAMINE (BENADRYL) 12.5 MG/5ML elixir 12.5-25 mg (has no administration in time range)  HYDROcodone-acetaminophen (NORCO/VICODIN) 5-325 MG per tablet 1-2 tablet (has no administration in time range)  morphine 2 MG/ML injection 2-4 mg (has no administration in time range)  ondansetron (ZOFRAN) injection 4 mg (has no administration in time range)  ciprofloxacin (CIPRO) IVPB 200 mg (has no administration in time range)  iohexol (OMNIPAQUE) 300 MG/ML solution 30 mL (30 mLs Oral Contrast Given 08/28/18 0541)  sodium chloride 0.9 % bolus 1,000 mL (0 mLs Intravenous Stopped 08/28/18 0912)    Mobility walks

## 2018-08-28 NOTE — Transfer of Care (Signed)
Immediate Anesthesia Transfer of Care Note  Patient: Joseph Rush  Procedure(s) Performed: CYSTOSCOPY LEFT STENT PLACEMENT INCISION URETEROCELE (Left Ureter)  Patient Location: PACU  Anesthesia Type:General  Level of Consciousness: awake and alert   Airway & Oxygen Therapy: Patient Spontanous Breathing and Patient connected to face mask oxygen  Post-op Assessment: Report given to RN and Post -op Vital signs reviewed and stable  Post vital signs: Reviewed and stable  Last Vitals:  Vitals Value Taken Time  BP 124/76 08/28/2018  6:15 PM  Temp    Pulse 61 08/28/2018  6:17 PM  Resp 14 08/28/2018  6:17 PM  SpO2 100 % 08/28/2018  6:17 PM  Vitals shown include unvalidated device data.  Last Pain:  Vitals:   08/28/18 1612  TempSrc:   PainSc: 4       Patients Stated Pain Goal: 2 (08/28/18 1044)  Complications: No apparent anesthesia complications

## 2018-08-28 NOTE — Anesthesia Procedure Notes (Signed)
Procedure Name: LMA Insertion Date/Time: 08/28/2018 5:14 PM Performed by: Theodosia QuayKey, Doyt Castellana, CRNA Pre-anesthesia Checklist: Patient identified, Emergency Drugs available, Suction available, Patient being monitored and Timeout performed Patient Re-evaluated:Patient Re-evaluated prior to induction Oxygen Delivery Method: Circle system utilized Preoxygenation: Pre-oxygenation with 100% oxygen Induction Type: IV induction Ventilation: Mask ventilation without difficulty LMA: LMA inserted Tube size: 4.0 mm Number of attempts: 1 Placement Confirmation: positive ETCO2 and breath sounds checked- equal and bilateral Tube secured with: Tape Dental Injury: Teeth and Oropharynx as per pre-operative assessment

## 2018-08-28 NOTE — H&P (Signed)
H&P  Chief Complaint: Left flank pain  History of Present Illness: Joseph Rush is a 52 y.o. year old male with a history of a solitary left kidney.  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  He also has a history of chronic kidney disease with a baseline creatinine around 1.4.  The patient presents to the [facility] emergency department with a 24-hour history of worsening left-sided flank pain.  He describes the pain as dull, intermittent, nonradiating and associated with nausea, but no vomiting.  He denies subjective fevers at home and is afebrile in the emergency department today.  Serum creatinine today-1.92  CT of the abdomen/pelvis from today reveals a markedly dilated left collecting system with what appears to be a ureterocele in the bladder.  No other obvious source of obstructive uropathy is identified.    The patient denies any knowledge of ever being diagnosed with a ureterocele and/or obstructive uropathy. He denies interval UTIs, dysuria or hematuria.   History reviewed. No pertinent past medical history.  Past Surgical History:  Procedure Laterality Date  . kidney removed  [age] yr age   unsure of which one    Home Medications:  No outpatient medications have been marked as taking for the 08/28/18 encounter [facility] Encounter).    Allergies:  Allergies  Allergen Reactions  . Penicillins     Has patient had a PCN reaction causing immediate rash, facial/tongue/throat swelling, SOB or lightheadedness with hypotension: Unknown Has patient had a PCN reaction causing severe rash involving mucus membranes or skin necrosis: Unknown Has patient had a PCN reaction that required hospitalization: Unknown Has patient had a PCN reaction occurring within the last 10 years: No If all of the above answers are "NO", then may proceed with Cephalosporin use.     Family History  Problem Relation  Age of Onset  . Lymphoma Mother     Social History:  reports that he has never smoked. He has never used smokeless tobacco. He reports that he drinks alcohol. He reports that he does not use drugs.  ROS: A complete review of systems was performed.  All systems are negative except for pertinent findings as noted.  Physical Exam:  Vital signs in last 24 hours: Temp:  [97.8 F (36.6 C)] 97.8 F (36.6 C) (11/13 0521) Pulse Rate:  [60-63] 60 (11/13 0701) Resp:  [17-19] 17 (11/13 0701) BP: (122-143)/(89-102) 143/89 (11/13 0701) SpO2:  [99 %-100 %] 100 % (11/13 0701) Weight:  [74.8 kg] 74.8 kg (11/13 0522) Constitutional:  Alert and oriented, No acute distress Cardiovascular: Regular rate and rhythm, No JVD Respiratory: Normal respiratory effort, Lungs clear bilaterally GI: Abdomen is soft, nontender, nondistended, no abdominal masses GU: Mild left CVA tenderness Lymphatic: No lymphadenopathy Neurologic: Grossly intact, no focal deficits Psychiatric: Normal mood and affect   Laboratory Data:  Recent Labs    08/28/18 0539  WBC 6.6  HGB 12.3*  HCT 36.5*  PLT 157    Recent Labs    08/28/18 0539  NA 141  K 4.2  CL 106  GLUCOSE 95  BUN 27*  CALCIUM 9.1  CREATININE 1.92*     Results for orders placed or performed during the hospital encounter of 08/28/18 (from the past 24 hour(s))  CBC with Differential/Platelet     Status: Abnormal   Collection Time: 08/28/18  5:39 AM  Result Value Ref Range   WBC 6.6 4.0 -  10.5 K/uL   RBC 4.09 (L) 4.22 - 5.81 MIL/uL   Hemoglobin 12.3 (L) 13.0 - 17.0 g/dL   HCT 16.136.5 (L) 09.639.0 - 04.552.0 %   MCV 89.2 80.0 - 100.0 fL   MCH 30.1 26.0 - 34.0 pg   MCHC 33.7 30.0 - 36.0 g/dL   RDW 40.912.6 81.111.5 - 91.415.5 %   Platelets 157 150 - 400 K/uL   nRBC 0.0 0.0 - 0.2 %   Neutrophils Relative % 64 %   Neutro Abs 4.3 1.7 - 7.7 K/uL   Lymphocytes Relative 22 %   Lymphs Abs 1.4 0.7 - 4.0 K/uL   Monocytes Relative 9 %   Monocytes Absolute 0.6 0.1 - 1.0 K/uL    Eosinophils Relative 4 %   Eosinophils Absolute 0.3 0.0 - 0.5 K/uL   Basophils Relative 1 %   Basophils Absolute 0.0 0.0 - 0.1 K/uL   Immature Granulocytes 0 %   Abs Immature Granulocytes 0.02 0.00 - 0.07 K/uL  Comprehensive metabolic panel     Status: Abnormal   Collection Time: 08/28/18  5:39 AM  Result Value Ref Range   Sodium 141 135 - 145 mmol/L   Potassium 4.2 3.5 - 5.1 mmol/L   Chloride 106 98 - 111 mmol/L   CO2 28 22 - 32 mmol/L   Glucose, Bld 95 70 - 99 mg/dL   BUN 27 (H) 6 - 20 mg/dL   Creatinine, Ser 7.821.92 (H) 0.61 - 1.24 mg/dL   Calcium 9.1 8.9 - 95.610.3 mg/dL   Total Protein 6.7 6.5 - 8.1 g/dL   Albumin 4.3 3.5 - 5.0 g/dL   AST 17 15 - 41 U/L   ALT 23 0 - 44 U/L   Alkaline Phosphatase 46 38 - 126 U/L   Total Bilirubin 1.1 0.3 - 1.2 mg/dL   GFR calc non Af Amer 38 (L) >60 mL/min   GFR calc Af Amer 45 (L) >60 mL/min   Anion gap 7 5 - 15  Urinalysis, Routine w reflex microscopic     Status: Abnormal   Collection Time: 08/28/18  5:39 AM  Result Value Ref Range   Color, Urine STRAW (A) YELLOW   APPearance CLEAR CLEAR   Specific Gravity, Urine 1.003 (L) 1.005 - 1.030   pH 5.0 5.0 - 8.0   Glucose, UA NEGATIVE NEGATIVE mg/dL   Hgb urine dipstick LARGE (A) NEGATIVE   Bilirubin Urine NEGATIVE NEGATIVE   Ketones, ur NEGATIVE NEGATIVE mg/dL   Protein, ur NEGATIVE NEGATIVE mg/dL   Nitrite NEGATIVE NEGATIVE   Leukocytes, UA NEGATIVE NEGATIVE   RBC / HPF 0-5 0 - 5 RBC/hpf   WBC, UA 0-5 0 - 5 WBC/hpf   Bacteria, UA NONE SEEN NONE SEEN   Squamous Epithelial / LPF 0-5 0 - 5  Lipase, blood     Status: None   Collection Time: 08/28/18  5:39 AM  Result Value Ref Range   Lipase 29 11 - 51 U/L  POC occult blood, ED RN will collect     Status: None   Collection Time: 08/28/18  6:33 AM  Result Value Ref Range   Fecal Occult Bld NEGATIVE NEGATIVE  CK     Status: None   Collection Time: 08/28/18  7:02 AM  Result Value Ref Range   Total CK 51 49 - 397 U/L   No results found  for this or any previous visit (from the past 240 hour(s)).  Renal Function: Recent Labs    08/28/18 0539  CREATININE 1.92*   Estimated Creatinine Clearance: 43.5 mL/min (A) (by C-G formula based on SCr of 1.92 mg/dL (H)).  Radiologic Imaging: Ct Abdomen Pelvis Wo Contrast  Result Date: 08/28/2018 CLINICAL DATA:  Left-sided abdominal pain and microhematuria EXAM: CT ABDOMEN AND PELVIS WITHOUT CONTRAST TECHNIQUE: Multidetector CT imaging of the abdomen and pelvis was performed following the standard protocol without IV contrast. Oral contrast was administered. COMPARISON:  None. FINDINGS: Lower chest: Lung bases are clear except for slight atelectasis in the posterior base regions. Hepatobiliary: No focal liver lesions are evident on this noncontrast enhanced study. Gallbladder wall is not appreciably thickened. There is no biliary duct dilatation. Pancreas: There is no pancreatic mass or inflammatory focus. Spleen: No splenic lesions are evident. Adrenals/Urinary Tract: Adrenals appear normal bilaterally. Right kidney is surgically absent. No lesion is seen in the right pararenal fossa. There is marked hydronephrosis on the left. There is marked dilatation of an extrarenal pelvis on the right. There is diffuse dilatation of the left ureter in its entirety. 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 No calculus or mass is evident on the left. Stomach/Bowel: There is no appreciable bowel wall or mesenteric thickening. There is no evident bowel obstruction. There is no appreciable free air or portal venous air. Vascular/Lymphatic: No abdominal aortic aneurysm. No vascular lesions are evident. There is no adenopathy in the abdomen or pelvis. Reproductive: Prostate and seminal vesicles appear normal in size and contour. No evident pelvic mass. Other: There is no periappendiceal region inflammation. No abscess or ascites evident in the abdomen or  pelvis. Musculoskeletal: No blastic or lytic bone lesions. No intramuscular or abdominal wall lesions are evident. IMPRESSION: 1. Marked hydronephrosis and ureterectasis on the left. 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 No renal or ureteral calculi evident on the left. No renal mass demonstrable on the left. Urologic consultation advised given this appearance on the left. 2. Status post right nephrectomy. No lesions seen in the right pararenal fossa region. 3. No evident bowel obstruction. No abscess in the abdomen pelvis. No periappendiceal region inflammatory change. Electronically Signed   By: Bretta Bang III M.D.   On: 08/28/2018 07:43    Assessment:  Left hydronephrosis possibly 2/2 to a ureterocele Solitary left kidney Acute on chronic renal failure  Plan:  -The risks, benefits and alternatives of cystoscopy with left ureteral stent placement and possible incision of left ureterocele was discussed with the patient.  Risks include, but are not limited to, bleeding, urinary tract infection, ureteral stricture formation, the potential need for multiple surgeries, the potential need for a ureteral reimplantation should the incision of the ureterocele fail, MI, CVA, PE, DVT, death and the inherent risks of general anesthesia.  He voices understanding and wishes to proceed.    Rhoderick Moody, MD 08/28/2018, 9:30 AM  [facility] Pager: [phone]

## 2018-08-28 NOTE — ED Triage Notes (Addendum)
Patient here from home with complaints of left upper abd pain since last Wednesday. Pain increase with taking deep breath. Denies n/v. Reports loss of appetite. "Feels full". Last ate yesterday around 2:30pm.

## 2018-08-28 NOTE — ED Notes (Signed)
[facility]01.

## 2018-08-28 NOTE — ED Notes (Signed)
Urology at bedside with patient.

## 2018-08-28 NOTE — ED Provider Notes (Signed)
[facility]-EMERGENCY DEPT Provider Note   CSN: [id number] Arrival date & time: 08/28/18  0515     History   Chief Complaint Chief Complaint  Patient presents with  . Abdominal Pain    HPI Joseph Rush is a 52 y.o. male.  Patient presents to the emergency department for evaluation of left upper abdominal pain.  Symptoms started approximately 1 week ago.  Patient has had persistent pain in the left upper abdomen that sometimes is affected by eating.  He reports that sometimes when he eats the pain improves briefly but other times eating causes the pain to worsen.  He has not had much of an appetite over the last 24 hours.  He has not had nausea, vomiting, diarrhea.  He does report that he has had decreased bowel movements.  He has not noticed any rectal bleeding or melanotic stools.     History reviewed. No pertinent past medical history.  Patient Active Problem List   Diagnosis Date Noted  . Travel advice encounter 02/06/2014    Past Surgical History:  Procedure Laterality Date  . kidney removed  [age] yr age   unsure of which one        Home Medications    Prior to Admission medications   Not on File    Family History Family History  Problem Relation Age of Onset  . Lymphoma Mother     Social History Social History   Tobacco Use  . Smoking status: Never Smoker  . Smokeless tobacco: Never Used  Substance Use Topics  . Alcohol use: Yes    Comment: 2 per month  . Drug use: No     Allergies   Penicillins   Review of Systems Review of Systems  Gastrointestinal: Positive for abdominal pain.  All other systems reviewed and are negative.    Physical Exam Updated Vital Signs BP (!) 143/89   Pulse 60   Temp 97.8 F (36.6 C) (Oral)   Resp 17   Ht 5\' 8"  (1.727 m)   Wt 74.8 kg   SpO2 100%   BMI 25.09 kg/m   Physical Exam  Constitutional: He is oriented to person, place, and time. He appears well-developed and  well-nourished. No distress.  HENT:  Head: Normocephalic and atraumatic.  Right Ear: Hearing normal.  Left Ear: Hearing normal.  Nose: Nose normal.  Mouth/Throat: Oropharynx is clear and moist and mucous membranes are normal.  Eyes: Pupils are equal, round, and reactive to light. Conjunctivae and EOM are normal.  Neck: Normal range of motion. Neck supple.  Cardiovascular: Regular rhythm, S1 normal and S2 normal. Exam reveals no gallop and no friction rub.  No murmur heard. Pulmonary/Chest: Effort normal and breath sounds normal. No respiratory distress. He exhibits no tenderness.  Abdominal: Soft. Normal appearance and bowel sounds are normal. There is no hepatosplenomegaly. There is tenderness in the left upper quadrant. There is no rebound, no guarding, no tenderness at McBurney's point and negative Murphy's sign. No hernia.  Musculoskeletal: Normal range of motion.  Neurological: He is alert and oriented to person, place, and time. He has normal strength. No cranial nerve deficit or sensory deficit. Coordination normal. GCS eye subscore is 4. GCS verbal subscore is 5. GCS motor subscore is 6.  Skin: Skin is warm, dry and intact. No rash noted. No cyanosis.  Psychiatric: He has a normal mood and affect. His speech is normal and behavior is normal. Thought content normal.  Nursing note and vitals  reviewed.    ED Treatments / Results  Labs (all labs ordered are listed, but only abnormal results are displayed) Labs Reviewed  CBC WITH DIFFERENTIAL/PLATELET - Abnormal; Notable for the following components:      Result Value   RBC 4.09 (*)    Hemoglobin 12.3 (*)    HCT 36.5 (*)    All other components within normal limits  COMPREHENSIVE METABOLIC PANEL - Abnormal; Notable for the following components:   BUN 27 (*)    Creatinine, Ser 1.92 (*)    GFR calc non Af Amer 38 (*)    GFR calc Af Amer 45 (*)    All other components within normal limits  URINALYSIS, ROUTINE W REFLEX MICROSCOPIC  - Abnormal; Notable for the following components:   Color, Urine STRAW (*)    Specific Gravity, Urine 1.003 (*)    Hgb urine dipstick LARGE (*)    All other components within normal limits  LIPASE, BLOOD  CK  POC OCCULT BLOOD, ED    EKG None  Radiology Ct Abdomen Pelvis Wo Contrast  Result Date: 08/28/2018 CLINICAL DATA:  Left-sided abdominal pain and microhematuria EXAM: CT ABDOMEN AND PELVIS WITHOUT CONTRAST TECHNIQUE: Multidetector CT imaging of the abdomen and pelvis was performed following the standard protocol without IV contrast. Oral contrast was administered. COMPARISON:  None. FINDINGS: Lower chest: Lung bases are clear except for slight atelectasis in the posterior base regions. Hepatobiliary: No focal liver lesions are evident on this noncontrast enhanced study. Gallbladder wall is not appreciably thickened. There is no biliary duct dilatation. Pancreas: There is no pancreatic mass or inflammatory focus. Spleen: No splenic lesions are evident. Adrenals/Urinary Tract: Adrenals appear normal bilaterally. Right kidney is surgically absent. No lesion is seen in the right pararenal fossa. There is marked hydronephrosis on the left. There is marked dilatation of an extrarenal pelvis on the right. There is diffuse dilatation of the left ureter in its entirety. 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 No calculus or mass is evident on the left. Stomach/Bowel: There is no appreciable bowel wall or mesenteric thickening. There is no evident bowel obstruction. There is no appreciable free air or portal venous air. Vascular/Lymphatic: No abdominal aortic aneurysm. No vascular lesions are evident. There is no adenopathy in the abdomen or pelvis. Reproductive: Prostate and seminal vesicles appear normal in size and contour. No evident pelvic mass. Other: There is no periappendiceal region inflammation. No abscess or ascites evident in the  abdomen or pelvis. Musculoskeletal: No blastic or lytic bone lesions. No intramuscular or abdominal wall lesions are evident. IMPRESSION: 1. Marked hydronephrosis and ureterectasis on the left. 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 No renal or ureteral calculi evident on the left. No renal mass demonstrable on the left. Urologic consultation advised given this appearance on the left. 2. Status post right nephrectomy. No lesions seen in the right pararenal fossa region. 3. No evident bowel obstruction. No abscess in the abdomen pelvis. No periappendiceal region inflammatory change. Electronically Signed   By: Bretta Bang III M.D.   On: 08/28/2018 07:43    Procedures Procedures (including critical care time)  Medications Ordered in ED Medications  iohexol (OMNIPAQUE) 300 MG/ML solution 30 mL (30 mLs Oral Contrast Given 08/28/18 0541)  sodium chloride 0.9 % bolus 1,000 mL (0 mLs Intravenous Stopped 08/28/18 0912)     Initial Impression / Assessment and Plan / ED Course  I have reviewed the triage  vital signs and the nursing notes.  Pertinent labs & imaging results that were available during my care of the patient were reviewed by me and considered in my medical decision making (see chart for details).     Patient presents to the emergency department for evaluation of left upper abdominal pain.  Symptoms ongoing for 1 week.  Patient has not had nausea, vomiting, diarrhea, has felt constipated.  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  Patient found to be mildly anemic, hemoglobin 12.3.  This is a normocytic anemia.  Rectal examination revealed no blood.  He has not noticed any bleeding, has not had a history of bleeding.  Patient reports baseline normal renal function.  Most recent creatinine in 2018 was 1.31.  It is 1.92 today.  Urinalysis reported large blood but only 0-5 red cells.  CK evaluated, was 51.  Patient underwent CT to further evaluate.  Patient found  to have severe hydronephrosis and hydroureter with ureterocele present. Urology will see patient in ER.  Final Clinical Impressions(s) / ED Diagnoses   Final diagnoses:  Urinary obstruction    ED Discharge Orders    None       Gilda Crease, MD 08/28/18 [phone]

## 2018-08-28 NOTE — ED Notes (Signed)
ED Provider at bedside. 

## 2018-08-29 ENCOUNTER — Encounter (HOSPITAL_COMMUNITY): Payer: Self-pay | Admitting: Urology

## 2018-08-29 DIAGNOSIS — N1330 Unspecified hydronephrosis: Secondary | ICD-10-CM | POA: Diagnosis not present

## 2018-08-29 DIAGNOSIS — N179 Acute kidney failure, unspecified: Secondary | ICD-10-CM | POA: Diagnosis not present

## 2018-08-29 DIAGNOSIS — Q6231 Congenital ureterocele, orthotopic: Secondary | ICD-10-CM | POA: Diagnosis not present

## 2018-08-29 LAB — BASIC METABOLIC PANEL
ANION GAP: 9 (ref 5–15)
BUN: 28 mg/dL — ABNORMAL HIGH (ref 6–20)
CALCIUM: 8.8 mg/dL — AB (ref 8.9–10.3)
CO2: 26 mmol/L (ref 22–32)
Chloride: 108 mmol/L (ref 98–111)
Creatinine, Ser: 1.92 mg/dL — ABNORMAL HIGH (ref 0.61–1.24)
GFR calc Af Amer: 45 mL/min — ABNORMAL LOW (ref >60)
GFR calc non Af Amer: 38 mL/min — ABNORMAL LOW (ref >60)
GLUCOSE: 130 mg/dL — AB (ref 70–99)
POTASSIUM: 4.8 mmol/L (ref 3.5–5.1)
Sodium: 143 mmol/L (ref 135–145)

## 2018-08-29 MED ORDER — OXYBUTYNIN CHLORIDE 5 MG PO TABS: 5.0000 mg | ORAL_TABLET | Freq: Three times a day (TID) | ORAL | 1 refills | Status: AC | PRN

## 2018-08-29 MED ORDER — TRAMADOL HCL 50 MG PO TABS: 50.0000 mg | ORAL_TABLET | Freq: Four times a day (QID) | ORAL | 0 refills | Status: AC | PRN

## 2018-08-29 MED ORDER — CIPROFLOXACIN HCL 250 MG PO TABS: 250.0000 mg | ORAL_TABLET | Freq: Two times a day (BID) | ORAL | 0 refills | Status: AC

## 2018-08-29 MED ORDER — PHENAZOPYRIDINE HCL 200 MG PO TABS: 200.0000 mg | ORAL_TABLET | Freq: Three times a day (TID) | ORAL | 0 refills | Status: AC | PRN

## 2018-08-29 MED ORDER — ONDANSETRON HCL 4 MG PO TABS: 4.0000 mg | ORAL_TABLET | Freq: Every day | ORAL | 1 refills | Status: AC | PRN

## 2018-08-29 NOTE — Discharge Summary (Signed)
Date of admission: 08/28/2018  Date of discharge: 08/29/2018  Admission diagnosis:  1.  Acute on chronic renal failure 2.  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  Solitary left kidney  Discharge diagnosis: Same  Procedures: Cystoscopy with incision of left ureterocele and left JJ stent placement.  History and Physical: For full details, please see admission history and physical. Briefly, Joseph Rush is a 52 y.o. year old patient with a history of a solitary left kidney who [facility] with worsening left sided flank pain and CT evidence of an obstructing left ureterocele.   Hospital Course: Following surgery, the patient was monitored on the floor.  He reports that his flank pain has markedly improved and that he is voiding without difficulty.  His serum creatinine was stable at 1.92  Physical Exam:  General: Alert and oriented CV: RRR, palpable distal pulses Lungs: CTAB, equal chest rise Abdomen: Soft, NTND, no rebound or guarding Ext: NT, No erythema  Laboratory values:  Recent Labs    08/28/18 0539  HGB 12.3*  HCT 36.5*   Recent Labs    08/28/18 0539 08/29/18 0515  CREATININE 1.92* 1.92*    Disposition: Home.  Follow up in one week with repeat BMP.  He will need to keep his left JJ stent in place for 6 weeks.   Discharge instruction: The patient was instructed to be ambulatory but told to refrain from heavy lifting, strenuous activity, or driving.  Discharge medications:  Allergies as of 08/29/2018      Reactions   Penicillins    Has patient had a PCN reaction causing immediate rash, facial/tongue/throat swelling, SOB or lightheadedness with hypotension: Unknown Has patient had a PCN reaction causing severe rash involving mucus membranes or skin necrosis: Unknown Has patient had a PCN reaction that required hospitalization: Unknown Has patient had a PCN reaction occurring within the last 10 years: No If all of the above  answers are "NO", then may proceed with Cephalosporin use.      Medication List    TAKE these medications   ciprofloxacin 250 MG tablet Commonly known as:  CIPRO Take 1 tablet (250 mg total) by mouth 2 (two) times daily for 3 days.   ondansetron 4 MG tablet Commonly known as:  ZOFRAN Take 1 tablet (4 mg total) by mouth daily as needed for nausea or vomiting.   oxybutynin 5 MG tablet Commonly known as:  DITROPAN Take 1 tablet (5 mg total) by mouth every 8 (eight) hours as needed for bladder spasms.   phenazopyridine 200 MG tablet Commonly known as:  PYRIDIUM Take 1 tablet (200 mg total) by mouth 3 (three) times daily as needed (for pain with urination).   traMADol 50 MG tablet Commonly known as:  ULTRAM Take 1 tablet (50 mg total) by mouth every 6 (six) hours as needed for up to 3 days.       Followup:  Follow-up Information    Rene PaciWinter, Brittain Hosie Aaron, MD In 1 week.   Specialty:  Urology Why:  My office will call to arrange a lab appointment as well as a f/u with me.  Contact information: [address] [place] [address] [phone]

## 2018-08-29 NOTE — Plan of Care (Signed)
Discharged instructions reviewed with patient, questions answered, verbalized understanding.  Hard copy prescriptions given to patient, stressed the importance of taking full course of antibiotics.  Patient verbalized understanding.  Patient ambulatory from unit accompanied by spouse.

## 2018-09-05 DIAGNOSIS — N183 Chronic kidney disease, stage 3 (moderate): Secondary | ICD-10-CM | POA: Diagnosis not present

## 2018-09-09 DIAGNOSIS — N139 Obstructive and reflux uropathy, unspecified: Secondary | ICD-10-CM | POA: Diagnosis not present

## 2018-09-09 DIAGNOSIS — Q6231 Congenital ureterocele, orthotopic: Secondary | ICD-10-CM | POA: Diagnosis not present

## 2018-09-09 DIAGNOSIS — N183 Chronic kidney disease, stage 3 (moderate): Secondary | ICD-10-CM | POA: Diagnosis not present

## 2018-09-09 DIAGNOSIS — N130 Hydronephrosis with ureteropelvic junction obstruction: Secondary | ICD-10-CM | POA: Diagnosis not present

## 2018-09-25 DIAGNOSIS — N130 Hydronephrosis with ureteropelvic junction obstruction: Secondary | ICD-10-CM | POA: Diagnosis not present

## 2018-10-04 DIAGNOSIS — N130 Hydronephrosis with ureteropelvic junction obstruction: Secondary | ICD-10-CM | POA: Diagnosis not present

## 2018-10-15 DIAGNOSIS — N130 Hydronephrosis with ureteropelvic junction obstruction: Secondary | ICD-10-CM | POA: Diagnosis not present

## 2018-10-24 DIAGNOSIS — R944 Abnormal results of kidney function studies: Secondary | ICD-10-CM | POA: Diagnosis not present

## 2018-10-25 DIAGNOSIS — Z6825 Body mass index (BMI) 25.0-25.9, adult: Secondary | ICD-10-CM | POA: Diagnosis not present

## 2018-10-25 DIAGNOSIS — N189 Chronic kidney disease, unspecified: Secondary | ICD-10-CM | POA: Diagnosis not present

## 2018-10-25 DIAGNOSIS — N183 Chronic kidney disease, stage 3 (moderate): Secondary | ICD-10-CM | POA: Diagnosis not present

## 2018-10-25 DIAGNOSIS — N2889 Other specified disorders of kidney and ureter: Secondary | ICD-10-CM | POA: Diagnosis not present

## 2018-10-25 DIAGNOSIS — Z880 Allergy status to penicillin: Secondary | ICD-10-CM | POA: Diagnosis not present

## 2018-10-25 DIAGNOSIS — Q600 Renal agenesis, unilateral: Secondary | ICD-10-CM | POA: Diagnosis not present

## 2018-10-25 DIAGNOSIS — N1330 Unspecified hydronephrosis: Secondary | ICD-10-CM | POA: Diagnosis not present

## 2018-10-31 ENCOUNTER — Other Ambulatory Visit: Payer: Self-pay | Admitting: Urology

## 2018-10-31 ENCOUNTER — Other Ambulatory Visit (HOSPITAL_COMMUNITY): Payer: Self-pay | Admitting: Urology

## 2018-10-31 DIAGNOSIS — N183 Chronic kidney disease, stage 3 (moderate): Secondary | ICD-10-CM | POA: Diagnosis not present

## 2018-10-31 DIAGNOSIS — N1330 Unspecified hydronephrosis: Secondary | ICD-10-CM | POA: Diagnosis not present

## 2018-10-31 DIAGNOSIS — N139 Obstructive and reflux uropathy, unspecified: Secondary | ICD-10-CM | POA: Diagnosis not present

## 2018-10-31 DIAGNOSIS — N130 Hydronephrosis with ureteropelvic junction obstruction: Secondary | ICD-10-CM

## 2018-11-15 ENCOUNTER — Ambulatory Visit (HOSPITAL_COMMUNITY)
Admission: RE | Admit: 2018-11-15 | Discharge: 2018-11-15 | Disposition: A | Discharge status: Home / Self Care | Payer: BLUE CROSS/BLUE SHIELD | Source: Ambulatory Visit | Attending: Urology | Admitting: Urology

## 2018-11-15 DIAGNOSIS — N130 Hydronephrosis with ureteropelvic junction obstruction: Secondary | ICD-10-CM

## 2018-11-15 DIAGNOSIS — N1339 Other hydronephrosis: Secondary | ICD-10-CM | POA: Diagnosis not present

## 2018-11-15 MED ORDER — FUROSEMIDE 10 MG/ML IJ SOLN
INTRAMUSCULAR | Status: AC
  Filled 2018-11-15: qty 4

## 2018-11-15 MED ORDER — FUROSEMIDE 10 MG/ML IJ SOLN
39.5000 mg | Freq: Once | INTRAMUSCULAR | Status: AC
  Administered 2018-11-15: 40 mg via INTRAVENOUS

## 2018-12-16 DIAGNOSIS — N2889 Other specified disorders of kidney and ureter: Secondary | ICD-10-CM | POA: Diagnosis not present

## 2018-12-16 DIAGNOSIS — Z905 Acquired absence of kidney: Secondary | ICD-10-CM | POA: Diagnosis not present

## 2018-12-16 DIAGNOSIS — R944 Abnormal results of kidney function studies: Secondary | ICD-10-CM | POA: Diagnosis not present

## 2018-12-23 DIAGNOSIS — N183 Chronic kidney disease, stage 3 (moderate): Secondary | ICD-10-CM | POA: Diagnosis not present

## 2018-12-23 DIAGNOSIS — Z125 Encounter for screening for malignant neoplasm of prostate: Secondary | ICD-10-CM | POA: Diagnosis not present

## 2019-02-06 DIAGNOSIS — Z125 Encounter for screening for malignant neoplasm of prostate: Secondary | ICD-10-CM | POA: Diagnosis not present

## 2019-02-06 DIAGNOSIS — N183 Chronic kidney disease, stage 3 (moderate): Secondary | ICD-10-CM | POA: Diagnosis not present

## 2019-02-13 DIAGNOSIS — R1084 Generalized abdominal pain: Secondary | ICD-10-CM | POA: Diagnosis not present

## 2019-02-13 DIAGNOSIS — N130 Hydronephrosis with ureteropelvic junction obstruction: Secondary | ICD-10-CM | POA: Diagnosis not present

## 2019-04-16 DIAGNOSIS — Z20828 Contact with and (suspected) exposure to other viral communicable diseases: Secondary | ICD-10-CM | POA: Diagnosis not present

## 2019-05-02 DIAGNOSIS — N183 Chronic kidney disease, stage 3 (moderate): Secondary | ICD-10-CM | POA: Diagnosis not present

## 2019-06-13 DIAGNOSIS — N2889 Other specified disorders of kidney and ureter: Secondary | ICD-10-CM | POA: Diagnosis not present

## 2019-06-13 DIAGNOSIS — Z905 Acquired absence of kidney: Secondary | ICD-10-CM | POA: Diagnosis not present

## 2019-06-13 DIAGNOSIS — N179 Acute kidney failure, unspecified: Secondary | ICD-10-CM | POA: Diagnosis not present

## 2019-06-13 DIAGNOSIS — R944 Abnormal results of kidney function studies: Secondary | ICD-10-CM | POA: Diagnosis not present

## 2019-06-19 DIAGNOSIS — H2513 Age-related nuclear cataract, bilateral: Secondary | ICD-10-CM | POA: Diagnosis not present

## 2019-06-19 DIAGNOSIS — H18613 Keratoconus, stable, bilateral: Secondary | ICD-10-CM | POA: Diagnosis not present

## 2019-06-19 DIAGNOSIS — H00024 Hordeolum internum left upper eyelid: Secondary | ICD-10-CM | POA: Diagnosis not present

## 2019-06-25 DIAGNOSIS — H00024 Hordeolum internum left upper eyelid: Secondary | ICD-10-CM | POA: Diagnosis not present

## 2019-06-26 DIAGNOSIS — H00024 Hordeolum internum left upper eyelid: Secondary | ICD-10-CM | POA: Diagnosis not present

## 2019-07-01 DIAGNOSIS — M7541 Impingement syndrome of right shoulder: Secondary | ICD-10-CM | POA: Diagnosis not present

## 2019-07-01 DIAGNOSIS — M25511 Pain in right shoulder: Secondary | ICD-10-CM | POA: Diagnosis not present

## 2019-07-01 DIAGNOSIS — M7551 Bursitis of right shoulder: Secondary | ICD-10-CM | POA: Diagnosis not present

## 2019-07-18 DIAGNOSIS — Z23 Encounter for immunization: Secondary | ICD-10-CM | POA: Diagnosis not present

## 2019-07-18 DIAGNOSIS — Z0000 Encounter for general adult medical examination without abnormal findings: Secondary | ICD-10-CM | POA: Diagnosis not present

## 2019-07-30 DIAGNOSIS — Z0000 Encounter for general adult medical examination without abnormal findings: Secondary | ICD-10-CM | POA: Diagnosis not present

## 2019-07-30 DIAGNOSIS — E785 Hyperlipidemia, unspecified: Secondary | ICD-10-CM | POA: Diagnosis not present

## 2019-08-04 ENCOUNTER — Other Ambulatory Visit: Payer: Self-pay | Admitting: Nephrology

## 2019-08-04 ENCOUNTER — Ambulatory Visit
Admission: RE | Admit: 2019-08-04 | Discharge: 2019-08-04 | Disposition: A | Discharge status: Home / Self Care | Payer: BLUE CROSS/BLUE SHIELD | Source: Ambulatory Visit | Attending: Nephrology | Admitting: Nephrology

## 2019-08-04 DIAGNOSIS — N179 Acute kidney failure, unspecified: Secondary | ICD-10-CM

## 2019-09-03 DIAGNOSIS — H01003 Unspecified blepharitis right eye, unspecified eyelid: Secondary | ICD-10-CM | POA: Diagnosis not present

## 2019-09-03 DIAGNOSIS — H5213 Myopia, bilateral: Secondary | ICD-10-CM | POA: Diagnosis not present

## 2019-09-03 DIAGNOSIS — H01006 Unspecified blepharitis left eye, unspecified eyelid: Secondary | ICD-10-CM | POA: Diagnosis not present

## 2019-09-03 DIAGNOSIS — H259 Unspecified age-related cataract: Secondary | ICD-10-CM | POA: Diagnosis not present

## 2019-09-09 DIAGNOSIS — N183 Chronic kidney disease, stage 3 unspecified: Secondary | ICD-10-CM | POA: Diagnosis not present

## 2019-09-25 DIAGNOSIS — N179 Acute kidney failure, unspecified: Secondary | ICD-10-CM | POA: Diagnosis not present

## 2019-09-30 DIAGNOSIS — M25511 Pain in right shoulder: Secondary | ICD-10-CM | POA: Diagnosis not present

## 2019-09-30 DIAGNOSIS — M7541 Impingement syndrome of right shoulder: Secondary | ICD-10-CM | POA: Diagnosis not present

## 2019-10-02 DIAGNOSIS — R1084 Generalized abdominal pain: Secondary | ICD-10-CM | POA: Diagnosis not present

## 2019-10-02 DIAGNOSIS — Q6231 Congenital ureterocele, orthotopic: Secondary | ICD-10-CM | POA: Diagnosis not present

## 2019-10-02 DIAGNOSIS — N130 Hydronephrosis with ureteropelvic junction obstruction: Secondary | ICD-10-CM | POA: Diagnosis not present

## 2019-10-03 ENCOUNTER — Other Ambulatory Visit (HOSPITAL_COMMUNITY): Payer: Self-pay | Admitting: Urology

## 2019-10-03 ENCOUNTER — Other Ambulatory Visit: Payer: Self-pay | Admitting: Urology

## 2019-10-03 DIAGNOSIS — N130 Hydronephrosis with ureteropelvic junction obstruction: Secondary | ICD-10-CM

## 2019-10-09 DIAGNOSIS — M7541 Impingement syndrome of right shoulder: Secondary | ICD-10-CM | POA: Diagnosis not present

## 2019-10-14 ENCOUNTER — Other Ambulatory Visit: Payer: Self-pay

## 2019-10-14 ENCOUNTER — Ambulatory Visit (HOSPITAL_COMMUNITY)
Admission: RE | Admit: 2019-10-14 | Discharge: 2019-10-14 | Disposition: A | Discharge status: Home / Self Care | Payer: BC Managed Care – PPO | Source: Ambulatory Visit | Attending: Urology | Admitting: Urology

## 2019-10-14 DIAGNOSIS — N1330 Unspecified hydronephrosis: Secondary | ICD-10-CM | POA: Diagnosis not present

## 2019-10-14 DIAGNOSIS — N130 Hydronephrosis with ureteropelvic junction obstruction: Secondary | ICD-10-CM | POA: Insufficient documentation

## 2019-10-14 MED ORDER — FUROSEMIDE 10 MG/ML IJ SOLN
INTRAMUSCULAR | Status: AC
  Filled 2019-10-14: qty 4

## 2019-10-14 MED ORDER — TECHNETIUM TC 99M MERTIATIDE
4.8000 | Freq: Once | INTRAVENOUS | Status: AC | PRN
  Administered 2019-10-14: 09:00:00 4.8 via INTRAVENOUS

## 2019-10-14 MED ORDER — FUROSEMIDE 10 MG/ML IJ SOLN
39.0000 mg | Freq: Once | INTRAMUSCULAR | Status: AC
  Administered 2019-10-14: 38 mg via INTRAVENOUS

## 2019-10-15 DIAGNOSIS — M7541 Impingement syndrome of right shoulder: Secondary | ICD-10-CM | POA: Diagnosis not present

## 2019-11-06 DIAGNOSIS — U071 COVID-19: Secondary | ICD-10-CM | POA: Diagnosis not present

## 2019-11-06 DIAGNOSIS — Z20822 Contact with and (suspected) exposure to covid-19: Secondary | ICD-10-CM | POA: Diagnosis not present

## 2019-11-06 DIAGNOSIS — Z20828 Contact with and (suspected) exposure to other viral communicable diseases: Secondary | ICD-10-CM | POA: Diagnosis not present

## 2019-11-14 DIAGNOSIS — R05 Cough: Secondary | ICD-10-CM | POA: Diagnosis not present

## 2019-12-23 DIAGNOSIS — N179 Acute kidney failure, unspecified: Secondary | ICD-10-CM | POA: Diagnosis not present

## 2020-01-20 DIAGNOSIS — M545 Low back pain: Secondary | ICD-10-CM | POA: Diagnosis not present

## 2020-01-23 ENCOUNTER — Ambulatory Visit: Payer: BC Managed Care – PPO | Admitting: Physical Therapy

## 2020-01-28 DIAGNOSIS — N179 Acute kidney failure, unspecified: Secondary | ICD-10-CM | POA: Diagnosis not present

## 2020-01-28 DIAGNOSIS — N2889 Other specified disorders of kidney and ureter: Secondary | ICD-10-CM | POA: Diagnosis not present

## 2020-01-28 DIAGNOSIS — Z905 Acquired absence of kidney: Secondary | ICD-10-CM | POA: Diagnosis not present

## 2020-01-28 DIAGNOSIS — R944 Abnormal results of kidney function studies: Secondary | ICD-10-CM | POA: Diagnosis not present

## 2020-02-04 DIAGNOSIS — M545 Low back pain: Secondary | ICD-10-CM | POA: Diagnosis not present

## 2020-02-04 DIAGNOSIS — R269 Unspecified abnormalities of gait and mobility: Secondary | ICD-10-CM | POA: Diagnosis not present

## 2020-03-23 DIAGNOSIS — R944 Abnormal results of kidney function studies: Secondary | ICD-10-CM | POA: Diagnosis not present

## 2020-03-23 DIAGNOSIS — N179 Acute kidney failure, unspecified: Secondary | ICD-10-CM | POA: Diagnosis not present

## 2020-03-24 DIAGNOSIS — N130 Hydronephrosis with ureteropelvic junction obstruction: Secondary | ICD-10-CM | POA: Diagnosis not present

## 2020-03-24 DIAGNOSIS — Z125 Encounter for screening for malignant neoplasm of prostate: Secondary | ICD-10-CM | POA: Diagnosis not present

## 2020-06-02 DIAGNOSIS — Z20822 Contact with and (suspected) exposure to covid-19: Secondary | ICD-10-CM | POA: Diagnosis not present

## 2020-06-02 DIAGNOSIS — Z9189 Other specified personal risk factors, not elsewhere classified: Secondary | ICD-10-CM | POA: Diagnosis not present

## 2020-06-03 DIAGNOSIS — M545 Low back pain: Secondary | ICD-10-CM | POA: Diagnosis not present

## 2020-07-05 DIAGNOSIS — M545 Low back pain: Secondary | ICD-10-CM | POA: Diagnosis not present

## 2020-07-07 DIAGNOSIS — N179 Acute kidney failure, unspecified: Secondary | ICD-10-CM | POA: Diagnosis not present

## 2020-07-13 DIAGNOSIS — M545 Low back pain: Secondary | ICD-10-CM | POA: Diagnosis not present

## 2020-07-13 DIAGNOSIS — M519 Unspecified thoracic, thoracolumbar and lumbosacral intervertebral disc disorder: Secondary | ICD-10-CM | POA: Diagnosis not present

## 2020-07-13 DIAGNOSIS — M5136 Other intervertebral disc degeneration, lumbar region: Secondary | ICD-10-CM | POA: Diagnosis not present

## 2020-08-18 DIAGNOSIS — Z905 Acquired absence of kidney: Secondary | ICD-10-CM | POA: Diagnosis not present

## 2020-08-18 DIAGNOSIS — Z125 Encounter for screening for malignant neoplasm of prostate: Secondary | ICD-10-CM | POA: Diagnosis not present

## 2020-08-18 DIAGNOSIS — E785 Hyperlipidemia, unspecified: Secondary | ICD-10-CM | POA: Diagnosis not present

## 2020-08-18 DIAGNOSIS — Z0000 Encounter for general adult medical examination without abnormal findings: Secondary | ICD-10-CM | POA: Diagnosis not present

## 2020-09-13 ENCOUNTER — Other Ambulatory Visit: Payer: Self-pay | Admitting: Nephrology

## 2020-09-13 DIAGNOSIS — N179 Acute kidney failure, unspecified: Secondary | ICD-10-CM

## 2020-09-28 ENCOUNTER — Ambulatory Visit
Admission: RE | Admit: 2020-09-28 | Discharge: 2020-09-28 | Disposition: A | Discharge status: Home / Self Care | Payer: BC Managed Care – PPO | Source: Ambulatory Visit | Attending: Nephrology | Admitting: Nephrology

## 2020-09-28 ENCOUNTER — Other Ambulatory Visit: Payer: BC Managed Care – PPO

## 2020-09-28 DIAGNOSIS — N179 Acute kidney failure, unspecified: Secondary | ICD-10-CM | POA: Diagnosis not present

## 2020-09-28 DIAGNOSIS — N1330 Unspecified hydronephrosis: Secondary | ICD-10-CM | POA: Diagnosis not present

## 2020-09-28 DIAGNOSIS — Z905 Acquired absence of kidney: Secondary | ICD-10-CM | POA: Diagnosis not present

## 2020-09-28 IMAGING — US US RENAL
1 series · 14 of 25 positions shown · non-contrast
Comparison: [DATE] renal sonogram. [DATE] CT
abdomen/pelvis.

CLINICAL DATA: Acute kidney injury.  History of right nephrectomy.

EXAM:
RENAL / URINARY TRACT ULTRASOUND COMPLETE

[Series 1: us renal · 0.23mm/px · 14 of 30 slices shown]
[im 1/30]
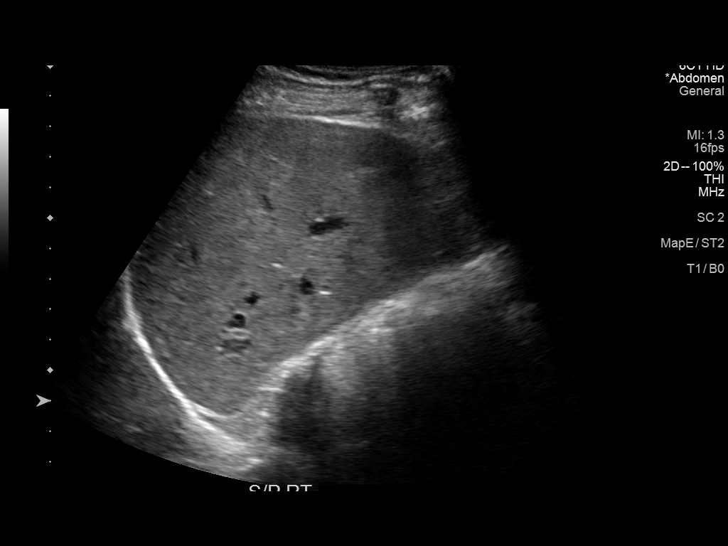
[im 3/30]
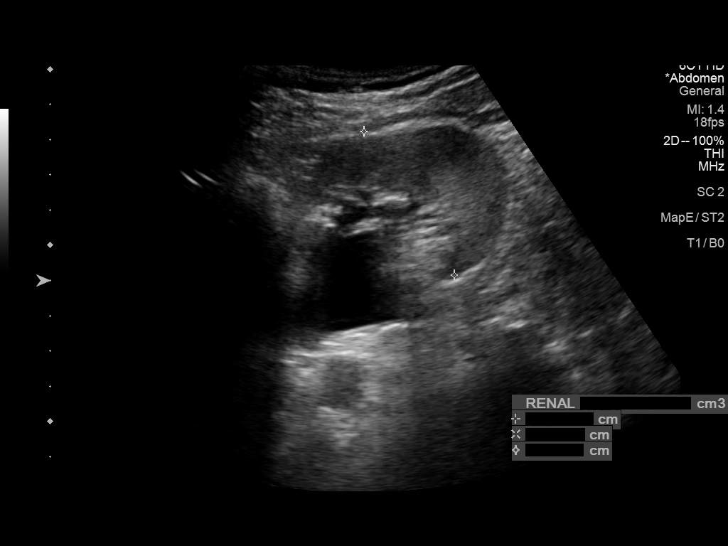
[im 5/30]
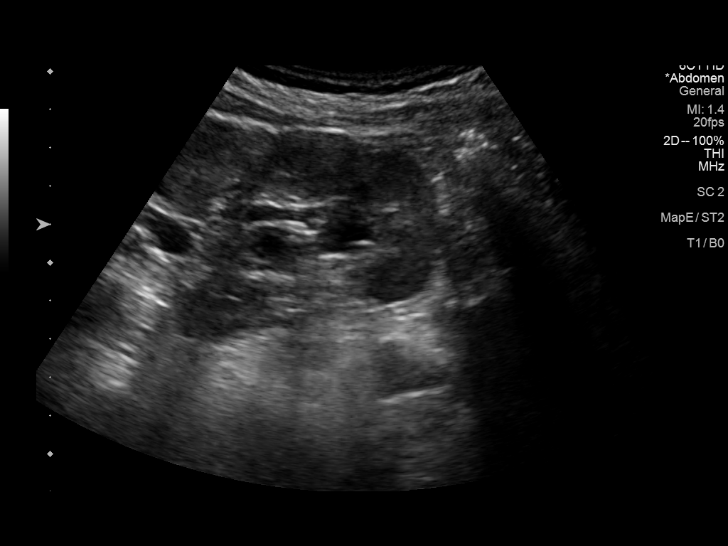
[im 8/30]
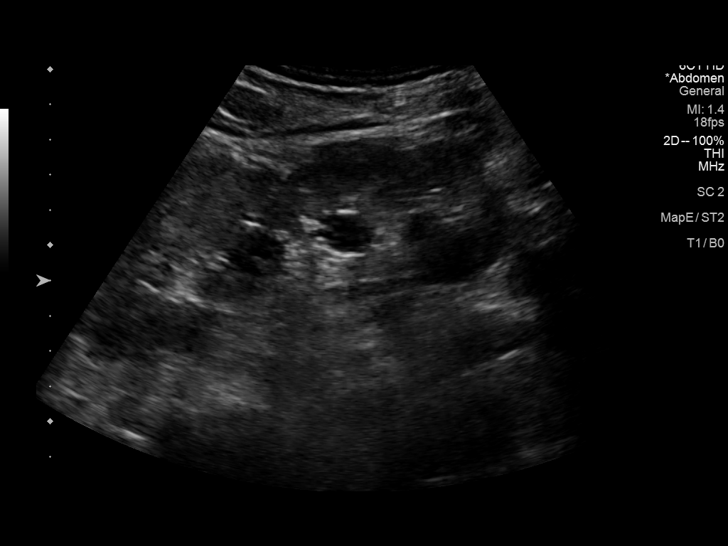
[im 10/30]
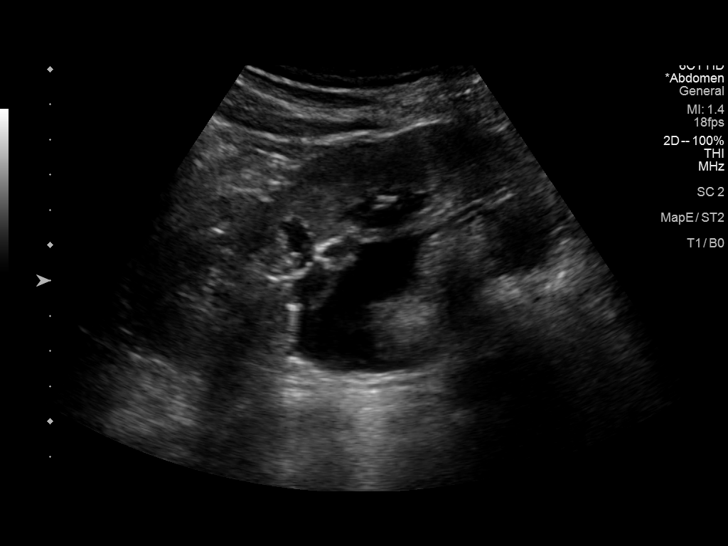
[im 11/30]
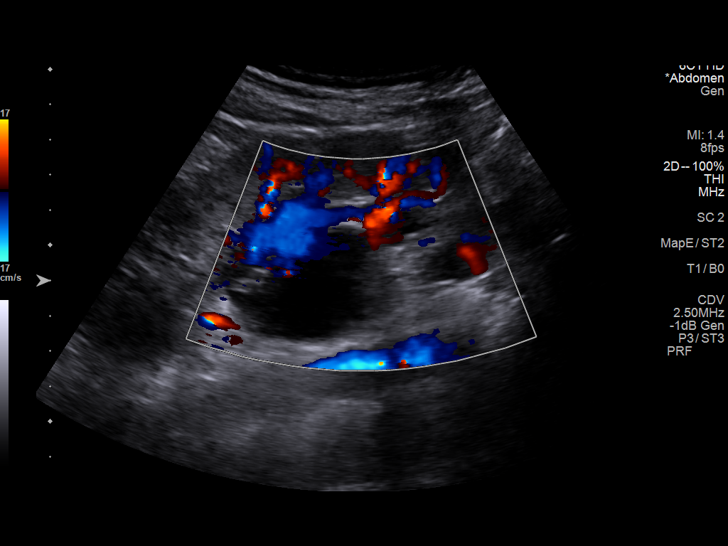
[im 14/30]
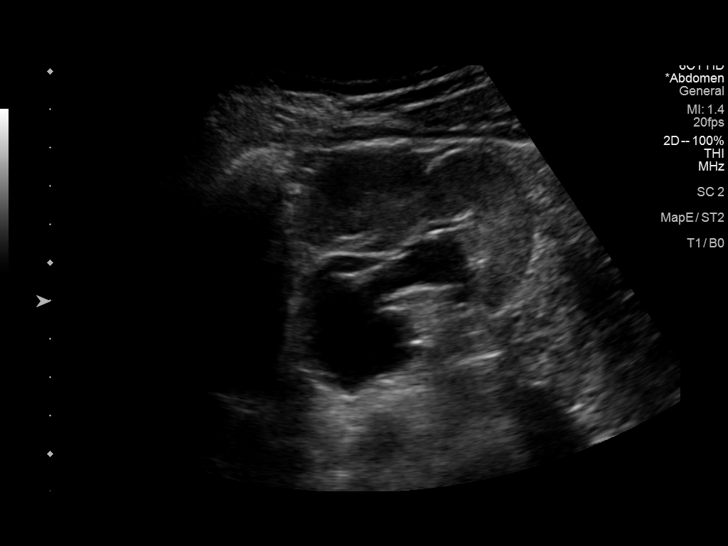
[im 16/30]
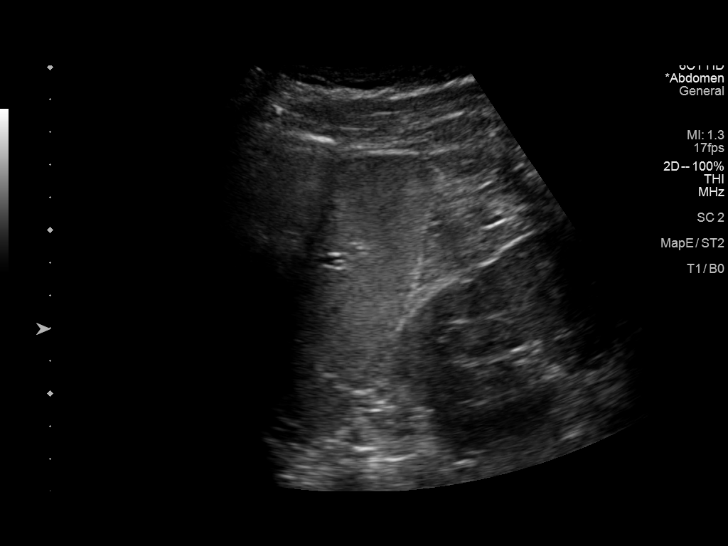
[im 19/30]
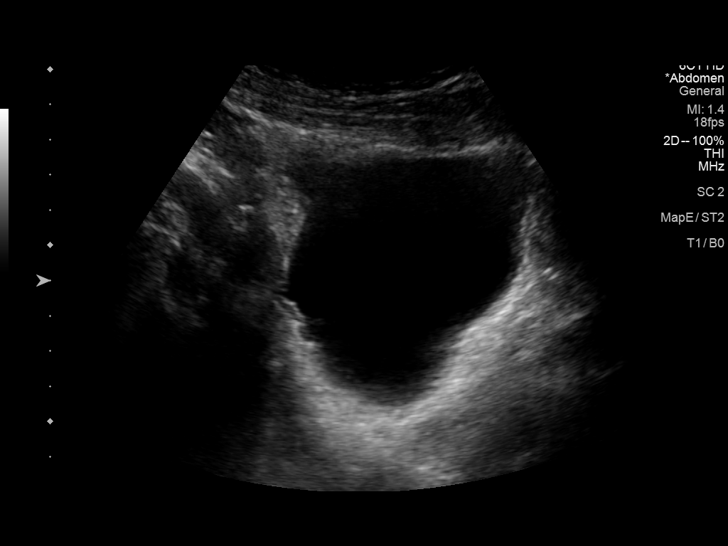
[im 20/30]
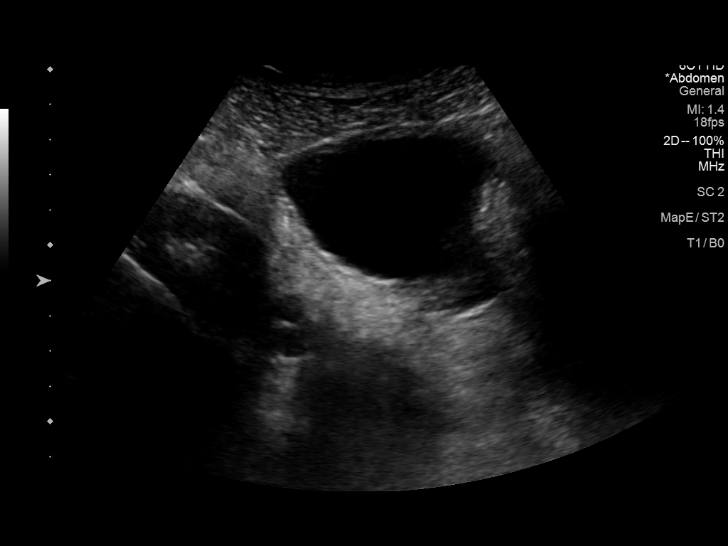
[im 22/30]
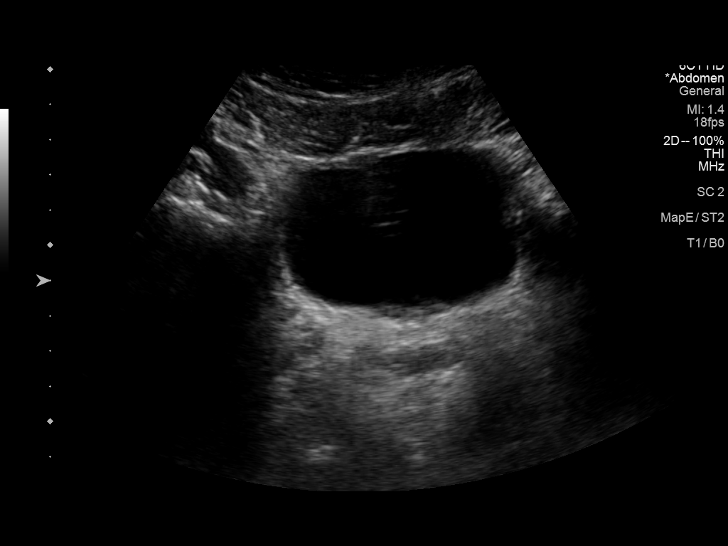
[im 25/30]
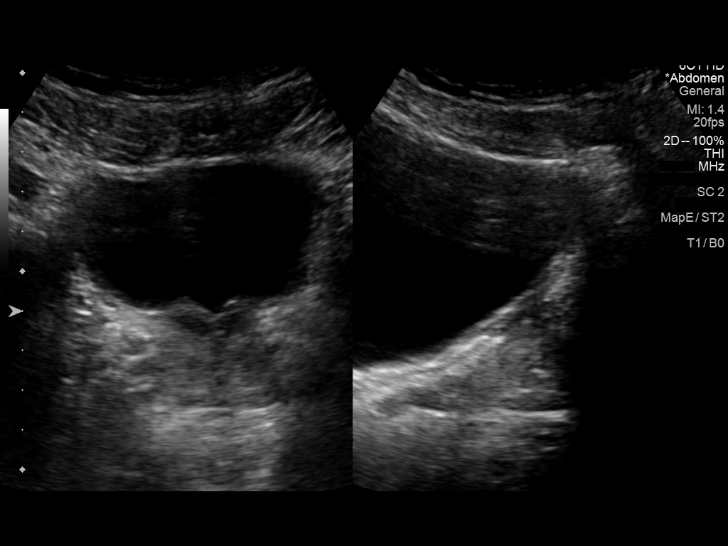
[im 27/30]
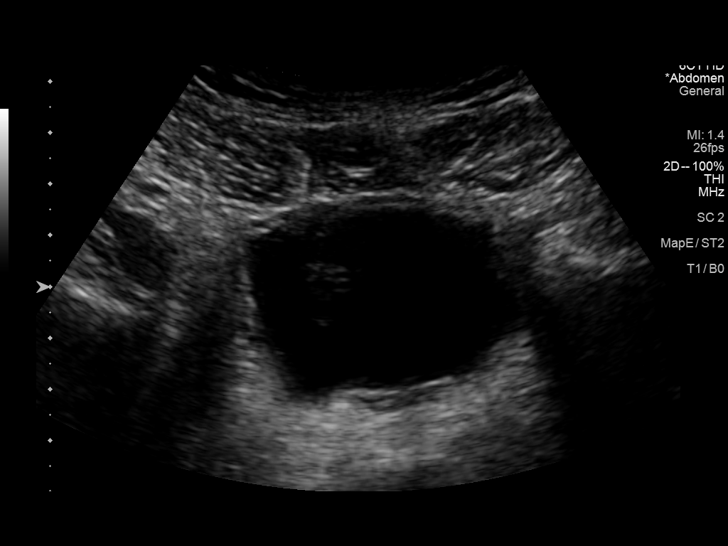
[im 30/30]
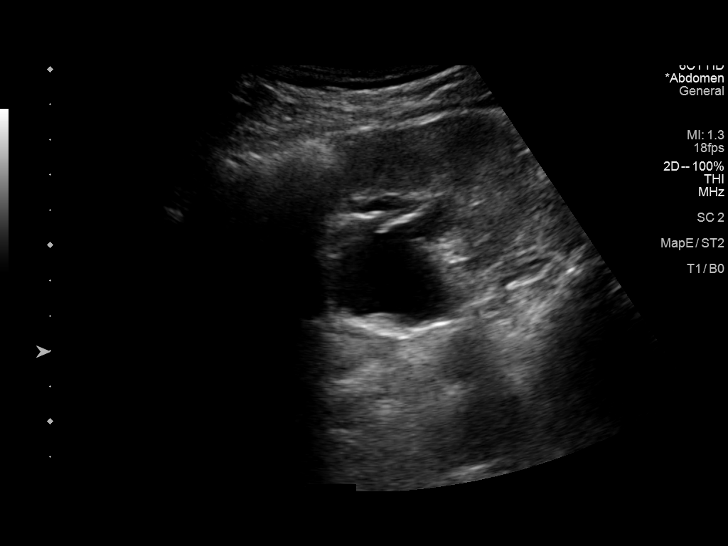

[14 of 25 positions shown; findings below may reference images not displayed]

FINDINGS: Right Kidney:

Surgically absent. No mass or fluid collection demonstrated in the
right nephrectomy bed.

Left Kidney:

Renal measurements: 13.2 x 6.0 x 4.8 cm = volume: 200 mL. Mildly
echogenic left renal parenchyma, normal thickness. Moderate left
hydronephrosis, mildly worsened from prior sonogram, not
substantially changed on postvoid imaging. No left renal mass.

Bladder:

Appears normal for degree of bladder distention. No left ureteral
jet visualized in the bladder.

Other:

None.
IMPRESSION: 1. Moderate left hydronephrosis, mildly worsened from [DATE]
sonogram, not substantially changed after bladder voiding.
2. Normal size echogenic left kidney, compatible with nonspecific
acute left renal parenchymal disease.
3. Right nephrectomy.
4. Normal bladder.

## 2020-09-30 DIAGNOSIS — N529 Male erectile dysfunction, unspecified: Secondary | ICD-10-CM | POA: Diagnosis not present

## 2020-12-06 DIAGNOSIS — Z905 Acquired absence of kidney: Secondary | ICD-10-CM | POA: Diagnosis not present

## 2020-12-06 DIAGNOSIS — N179 Acute kidney failure, unspecified: Secondary | ICD-10-CM | POA: Diagnosis not present

## 2020-12-06 DIAGNOSIS — N2889 Other specified disorders of kidney and ureter: Secondary | ICD-10-CM | POA: Diagnosis not present

## 2020-12-06 DIAGNOSIS — R944 Abnormal results of kidney function studies: Secondary | ICD-10-CM | POA: Diagnosis not present

## 2021-01-06 ENCOUNTER — Other Ambulatory Visit: Payer: Self-pay | Admitting: Nephrology

## 2021-01-06 DIAGNOSIS — N179 Acute kidney failure, unspecified: Secondary | ICD-10-CM

## 2021-01-06 DIAGNOSIS — R109 Unspecified abdominal pain: Secondary | ICD-10-CM

## 2021-01-06 DIAGNOSIS — R10A0 Flank pain, unspecified side: Secondary | ICD-10-CM

## 2021-01-12 ENCOUNTER — Ambulatory Visit
Admission: RE | Admit: 2021-01-12 | Discharge: 2021-01-12 | Disposition: A | Discharge status: Home / Self Care | Payer: BC Managed Care – PPO | Source: Ambulatory Visit | Attending: Nephrology | Admitting: Nephrology

## 2021-01-12 DIAGNOSIS — N179 Acute kidney failure, unspecified: Secondary | ICD-10-CM | POA: Diagnosis not present

## 2021-01-12 DIAGNOSIS — R109 Unspecified abdominal pain: Secondary | ICD-10-CM

## 2021-01-12 DIAGNOSIS — N2881 Hypertrophy of kidney: Secondary | ICD-10-CM | POA: Diagnosis not present

## 2021-01-12 DIAGNOSIS — Z905 Acquired absence of kidney: Secondary | ICD-10-CM | POA: Diagnosis not present

## 2021-03-08 DIAGNOSIS — R944 Abnormal results of kidney function studies: Secondary | ICD-10-CM | POA: Diagnosis not present

## 2021-03-28 DIAGNOSIS — N130 Hydronephrosis with ureteropelvic junction obstruction: Secondary | ICD-10-CM | POA: Diagnosis not present

## 2021-03-31 DIAGNOSIS — N130 Hydronephrosis with ureteropelvic junction obstruction: Secondary | ICD-10-CM | POA: Diagnosis not present

## 2021-03-31 DIAGNOSIS — Q6231 Congenital ureterocele, orthotopic: Secondary | ICD-10-CM | POA: Diagnosis not present

## 2021-06-06 DIAGNOSIS — M25512 Pain in left shoulder: Secondary | ICD-10-CM | POA: Diagnosis not present

## 2021-06-08 DIAGNOSIS — N179 Acute kidney failure, unspecified: Secondary | ICD-10-CM | POA: Diagnosis not present

## 2021-06-27 DIAGNOSIS — H2513 Age-related nuclear cataract, bilateral: Secondary | ICD-10-CM | POA: Diagnosis not present

## 2021-06-27 DIAGNOSIS — H18613 Keratoconus, stable, bilateral: Secondary | ICD-10-CM | POA: Diagnosis not present

## 2021-07-06 DIAGNOSIS — N179 Acute kidney failure, unspecified: Secondary | ICD-10-CM | POA: Diagnosis not present

## 2021-07-14 DIAGNOSIS — Z23 Encounter for immunization: Secondary | ICD-10-CM | POA: Diagnosis not present

## 2021-07-14 DIAGNOSIS — N179 Acute kidney failure, unspecified: Secondary | ICD-10-CM | POA: Diagnosis not present

## 2021-07-14 DIAGNOSIS — R944 Abnormal results of kidney function studies: Secondary | ICD-10-CM | POA: Diagnosis not present

## 2021-07-14 DIAGNOSIS — N2889 Other specified disorders of kidney and ureter: Secondary | ICD-10-CM | POA: Diagnosis not present

## 2021-07-14 DIAGNOSIS — Z905 Acquired absence of kidney: Secondary | ICD-10-CM | POA: Diagnosis not present

## 2021-07-29 DIAGNOSIS — M25512 Pain in left shoulder: Secondary | ICD-10-CM | POA: Diagnosis not present

## 2021-08-15 DIAGNOSIS — M25512 Pain in left shoulder: Secondary | ICD-10-CM | POA: Diagnosis not present

## 2021-08-24 DIAGNOSIS — M75102 Unspecified rotator cuff tear or rupture of left shoulder, not specified as traumatic: Secondary | ICD-10-CM | POA: Diagnosis not present

## 2021-08-30 DIAGNOSIS — M25512 Pain in left shoulder: Secondary | ICD-10-CM | POA: Diagnosis not present

## 2021-08-30 DIAGNOSIS — M7542 Impingement syndrome of left shoulder: Secondary | ICD-10-CM | POA: Diagnosis not present

## 2021-08-30 DIAGNOSIS — G8918 Other acute postprocedural pain: Secondary | ICD-10-CM | POA: Diagnosis not present

## 2021-08-30 DIAGNOSIS — M19012 Primary osteoarthritis, left shoulder: Secondary | ICD-10-CM | POA: Diagnosis not present

## 2021-08-30 DIAGNOSIS — Z4889 Encounter for other specified surgical aftercare: Secondary | ICD-10-CM | POA: Diagnosis not present

## 2021-08-30 DIAGNOSIS — M24112 Other articular cartilage disorders, left shoulder: Secondary | ICD-10-CM | POA: Diagnosis not present

## 2021-08-30 DIAGNOSIS — M7502 Adhesive capsulitis of left shoulder: Secondary | ICD-10-CM | POA: Diagnosis not present

## 2021-08-30 DIAGNOSIS — M7522 Bicipital tendinitis, left shoulder: Secondary | ICD-10-CM | POA: Diagnosis not present

## 2021-08-30 DIAGNOSIS — I890 Lymphedema, not elsewhere classified: Secondary | ICD-10-CM | POA: Diagnosis not present

## 2021-08-30 DIAGNOSIS — M75122 Complete rotator cuff tear or rupture of left shoulder, not specified as traumatic: Secondary | ICD-10-CM | POA: Diagnosis not present

## 2021-08-30 DIAGNOSIS — S43432A Superior glenoid labrum lesion of left shoulder, initial encounter: Secondary | ICD-10-CM | POA: Diagnosis not present

## 2021-09-05 DIAGNOSIS — M25512 Pain in left shoulder: Secondary | ICD-10-CM | POA: Diagnosis not present

## 2021-09-12 DIAGNOSIS — Z0000 Encounter for general adult medical examination without abnormal findings: Secondary | ICD-10-CM | POA: Diagnosis not present

## 2021-09-16 DIAGNOSIS — M25512 Pain in left shoulder: Secondary | ICD-10-CM | POA: Diagnosis not present

## 2021-09-19 DIAGNOSIS — M25512 Pain in left shoulder: Secondary | ICD-10-CM | POA: Diagnosis not present

## 2021-09-20 ENCOUNTER — Other Ambulatory Visit: Payer: Self-pay | Admitting: Nephrology

## 2021-09-20 DIAGNOSIS — R944 Abnormal results of kidney function studies: Secondary | ICD-10-CM

## 2021-09-26 DIAGNOSIS — R944 Abnormal results of kidney function studies: Secondary | ICD-10-CM | POA: Diagnosis not present

## 2021-09-29 DIAGNOSIS — M25512 Pain in left shoulder: Secondary | ICD-10-CM | POA: Diagnosis not present

## 2021-10-04 ENCOUNTER — Ambulatory Visit
Admission: RE | Admit: 2021-10-04 | Discharge: 2021-10-04 | Disposition: A | Discharge status: Home / Self Care | Payer: BC Managed Care – PPO | Source: Ambulatory Visit | Attending: Nephrology | Admitting: Nephrology

## 2021-10-04 DIAGNOSIS — N1330 Unspecified hydronephrosis: Secondary | ICD-10-CM | POA: Diagnosis not present

## 2021-10-04 DIAGNOSIS — R944 Abnormal results of kidney function studies: Secondary | ICD-10-CM

## 2021-10-04 DIAGNOSIS — M25512 Pain in left shoulder: Secondary | ICD-10-CM | POA: Diagnosis not present

## 2021-10-04 DIAGNOSIS — Z905 Acquired absence of kidney: Secondary | ICD-10-CM | POA: Diagnosis not present

## 2021-10-19 DIAGNOSIS — R944 Abnormal results of kidney function studies: Secondary | ICD-10-CM | POA: Diagnosis not present

## 2021-10-19 DIAGNOSIS — M25512 Pain in left shoulder: Secondary | ICD-10-CM | POA: Diagnosis not present

## 2021-10-20 DIAGNOSIS — N179 Acute kidney failure, unspecified: Secondary | ICD-10-CM | POA: Diagnosis not present

## 2021-10-20 DIAGNOSIS — N2889 Other specified disorders of kidney and ureter: Secondary | ICD-10-CM | POA: Diagnosis not present

## 2021-10-20 DIAGNOSIS — Z905 Acquired absence of kidney: Secondary | ICD-10-CM | POA: Diagnosis not present

## 2021-10-20 DIAGNOSIS — R944 Abnormal results of kidney function studies: Secondary | ICD-10-CM | POA: Diagnosis not present

## 2021-10-21 DIAGNOSIS — M25512 Pain in left shoulder: Secondary | ICD-10-CM | POA: Diagnosis not present

## 2021-10-25 DIAGNOSIS — M25512 Pain in left shoulder: Secondary | ICD-10-CM | POA: Diagnosis not present

## 2021-10-27 DIAGNOSIS — M25512 Pain in left shoulder: Secondary | ICD-10-CM | POA: Diagnosis not present

## 2021-11-03 DIAGNOSIS — M25512 Pain in left shoulder: Secondary | ICD-10-CM | POA: Diagnosis not present

## 2021-11-04 DIAGNOSIS — M25512 Pain in left shoulder: Secondary | ICD-10-CM | POA: Diagnosis not present

## 2021-11-08 DIAGNOSIS — M25512 Pain in left shoulder: Secondary | ICD-10-CM | POA: Diagnosis not present

## 2021-11-10 DIAGNOSIS — M25512 Pain in left shoulder: Secondary | ICD-10-CM | POA: Diagnosis not present

## 2021-11-15 DIAGNOSIS — M25512 Pain in left shoulder: Secondary | ICD-10-CM | POA: Diagnosis not present

## 2021-11-17 DIAGNOSIS — M25512 Pain in left shoulder: Secondary | ICD-10-CM | POA: Diagnosis not present

## 2021-11-22 DIAGNOSIS — M25512 Pain in left shoulder: Secondary | ICD-10-CM | POA: Diagnosis not present

## 2021-11-24 DIAGNOSIS — M25512 Pain in left shoulder: Secondary | ICD-10-CM | POA: Diagnosis not present

## 2021-12-07 DIAGNOSIS — M25512 Pain in left shoulder: Secondary | ICD-10-CM | POA: Diagnosis not present

## 2022-02-09 DIAGNOSIS — R944 Abnormal results of kidney function studies: Secondary | ICD-10-CM | POA: Diagnosis not present

## 2022-02-09 DIAGNOSIS — N179 Acute kidney failure, unspecified: Secondary | ICD-10-CM | POA: Diagnosis not present

## 2022-03-24 DIAGNOSIS — N130 Hydronephrosis with ureteropelvic junction obstruction: Secondary | ICD-10-CM | POA: Diagnosis not present

## 2022-03-30 DIAGNOSIS — N130 Hydronephrosis with ureteropelvic junction obstruction: Secondary | ICD-10-CM | POA: Diagnosis not present

## 2022-03-30 DIAGNOSIS — N182 Chronic kidney disease, stage 2 (mild): Secondary | ICD-10-CM | POA: Diagnosis not present

## 2022-05-22 DIAGNOSIS — Z1211 Encounter for screening for malignant neoplasm of colon: Secondary | ICD-10-CM | POA: Diagnosis not present

## 2022-06-05 DIAGNOSIS — N179 Acute kidney failure, unspecified: Secondary | ICD-10-CM | POA: Diagnosis not present

## 2022-06-14 DIAGNOSIS — N179 Acute kidney failure, unspecified: Secondary | ICD-10-CM | POA: Diagnosis not present

## 2022-06-14 DIAGNOSIS — E8729 Other acidosis: Secondary | ICD-10-CM | POA: Diagnosis not present

## 2022-06-14 DIAGNOSIS — R944 Abnormal results of kidney function studies: Secondary | ICD-10-CM | POA: Diagnosis not present

## 2022-06-14 DIAGNOSIS — Z905 Acquired absence of kidney: Secondary | ICD-10-CM | POA: Diagnosis not present

## 2022-06-27 DIAGNOSIS — R944 Abnormal results of kidney function studies: Secondary | ICD-10-CM | POA: Diagnosis not present

## 2022-08-25 ENCOUNTER — Other Ambulatory Visit: Payer: Self-pay | Admitting: Cardiology

## 2022-08-25 DIAGNOSIS — Z136 Encounter for screening for cardiovascular disorders: Secondary | ICD-10-CM

## 2022-08-31 DIAGNOSIS — D122 Benign neoplasm of ascending colon: Secondary | ICD-10-CM | POA: Diagnosis not present

## 2022-08-31 DIAGNOSIS — D120 Benign neoplasm of cecum: Secondary | ICD-10-CM | POA: Diagnosis not present

## 2022-08-31 DIAGNOSIS — Z1211 Encounter for screening for malignant neoplasm of colon: Secondary | ICD-10-CM | POA: Diagnosis not present

## 2022-08-31 DIAGNOSIS — K635 Polyp of colon: Secondary | ICD-10-CM | POA: Diagnosis not present

## 2022-09-11 DIAGNOSIS — J029 Acute pharyngitis, unspecified: Secondary | ICD-10-CM | POA: Diagnosis not present

## 2022-09-11 DIAGNOSIS — R051 Acute cough: Secondary | ICD-10-CM | POA: Diagnosis not present

## 2022-09-11 DIAGNOSIS — B349 Viral infection, unspecified: Secondary | ICD-10-CM | POA: Diagnosis not present

## 2022-09-21 DIAGNOSIS — N179 Acute kidney failure, unspecified: Secondary | ICD-10-CM | POA: Diagnosis not present

## 2022-09-22 DIAGNOSIS — Z0000 Encounter for general adult medical examination without abnormal findings: Secondary | ICD-10-CM | POA: Diagnosis not present

## 2022-10-06 ENCOUNTER — Ambulatory Visit
Admission: RE | Admit: 2022-10-06 | Discharge: 2022-10-06 | Disposition: A | Discharge status: Home / Self Care | Payer: No Typology Code available for payment source | Source: Ambulatory Visit | Attending: Cardiology | Admitting: Cardiology

## 2022-10-06 DIAGNOSIS — Z136 Encounter for screening for cardiovascular disorders: Secondary | ICD-10-CM

## 2022-10-10 NOTE — Progress Notes (Signed)
Coronary calcium score  10/10/2022: Calcium score of 0. Thoracic aorta normal in size.  Insignificant nodules in the lower lobes. No follow up needed

## 2022-12-12 DIAGNOSIS — R944 Abnormal results of kidney function studies: Secondary | ICD-10-CM | POA: Diagnosis not present

## 2022-12-18 DIAGNOSIS — N4342 Spermatocele of epididymis, multiple: Secondary | ICD-10-CM | POA: Diagnosis not present

## 2023-01-02 DIAGNOSIS — R944 Abnormal results of kidney function studies: Secondary | ICD-10-CM | POA: Diagnosis not present

## 2023-01-09 DIAGNOSIS — Z905 Acquired absence of kidney: Secondary | ICD-10-CM | POA: Diagnosis not present

## 2023-01-09 DIAGNOSIS — N2889 Other specified disorders of kidney and ureter: Secondary | ICD-10-CM | POA: Diagnosis not present

## 2023-01-09 DIAGNOSIS — N179 Acute kidney failure, unspecified: Secondary | ICD-10-CM | POA: Diagnosis not present

## 2023-01-09 DIAGNOSIS — R944 Abnormal results of kidney function studies: Secondary | ICD-10-CM | POA: Diagnosis not present

## 2023-02-05 DIAGNOSIS — N4342 Spermatocele of epididymis, multiple: Secondary | ICD-10-CM | POA: Diagnosis not present

## 2023-02-05 DIAGNOSIS — N130 Hydronephrosis with ureteropelvic junction obstruction: Secondary | ICD-10-CM | POA: Diagnosis not present

## 2023-03-27 DIAGNOSIS — N179 Acute kidney failure, unspecified: Secondary | ICD-10-CM | POA: Diagnosis not present

## 2023-03-28 DIAGNOSIS — N130 Hydronephrosis with ureteropelvic junction obstruction: Secondary | ICD-10-CM | POA: Diagnosis not present

## 2023-07-02 DIAGNOSIS — N179 Acute kidney failure, unspecified: Secondary | ICD-10-CM | POA: Diagnosis not present

## 2023-07-26 DIAGNOSIS — R944 Abnormal results of kidney function studies: Secondary | ICD-10-CM | POA: Diagnosis not present

## 2023-07-26 DIAGNOSIS — N2889 Other specified disorders of kidney and ureter: Secondary | ICD-10-CM | POA: Diagnosis not present

## 2023-07-26 DIAGNOSIS — Z905 Acquired absence of kidney: Secondary | ICD-10-CM | POA: Diagnosis not present

## 2023-07-26 DIAGNOSIS — N179 Acute kidney failure, unspecified: Secondary | ICD-10-CM | POA: Diagnosis not present

## 2023-07-31 ENCOUNTER — Other Ambulatory Visit: Payer: Self-pay | Admitting: Nephrology

## 2023-07-31 DIAGNOSIS — N179 Acute kidney failure, unspecified: Secondary | ICD-10-CM

## 2023-08-02 ENCOUNTER — Ambulatory Visit
Admission: RE | Admit: 2023-08-02 | Discharge: 2023-08-02 | Disposition: A | Discharge status: Home / Self Care | Payer: BC Managed Care – PPO | Source: Ambulatory Visit | Attending: Nephrology | Admitting: Nephrology

## 2023-08-02 DIAGNOSIS — Z905 Acquired absence of kidney: Secondary | ICD-10-CM | POA: Diagnosis not present

## 2023-08-02 DIAGNOSIS — N1330 Unspecified hydronephrosis: Secondary | ICD-10-CM | POA: Diagnosis not present

## 2023-08-02 DIAGNOSIS — N179 Acute kidney failure, unspecified: Secondary | ICD-10-CM

## 2023-08-20 ENCOUNTER — Other Ambulatory Visit (HOSPITAL_COMMUNITY): Payer: Self-pay | Admitting: Urology

## 2023-08-20 DIAGNOSIS — N182 Chronic kidney disease, stage 2 (mild): Secondary | ICD-10-CM

## 2023-08-27 ENCOUNTER — Ambulatory Visit (HOSPITAL_COMMUNITY)
Admission: RE | Admit: 2023-08-27 | Discharge: 2023-08-27 | Disposition: A | Discharge status: Home / Self Care | Payer: BC Managed Care – PPO | Source: Ambulatory Visit | Attending: Urology | Admitting: Urology

## 2023-08-27 DIAGNOSIS — N182 Chronic kidney disease, stage 2 (mild): Secondary | ICD-10-CM | POA: Insufficient documentation

## 2023-08-27 DIAGNOSIS — N1330 Unspecified hydronephrosis: Secondary | ICD-10-CM | POA: Diagnosis not present

## 2023-08-27 DIAGNOSIS — Z905 Acquired absence of kidney: Secondary | ICD-10-CM | POA: Diagnosis not present

## 2023-08-27 MED ORDER — FUROSEMIDE 10 MG/ML IJ SOLN
INTRAMUSCULAR | Status: AC
  Filled 2023-08-27: qty 4

## 2023-08-27 MED ORDER — TECHNETIUM TC 99M MERTIATIDE
5.6000 | Freq: Once | INTRAVENOUS | Status: AC | PRN
  Administered 2023-08-27: 5.6 via INTRAVENOUS

## 2023-08-27 MED ORDER — FUROSEMIDE 10 MG/ML IJ SOLN: 38.0000 mg | Freq: Once | INTRAMUSCULAR | Status: DC

## 2023-09-05 DIAGNOSIS — N130 Hydronephrosis with ureteropelvic junction obstruction: Secondary | ICD-10-CM | POA: Diagnosis not present

## 2023-09-25 DIAGNOSIS — Z125 Encounter for screening for malignant neoplasm of prostate: Secondary | ICD-10-CM | POA: Diagnosis not present

## 2023-09-25 DIAGNOSIS — Z13220 Encounter for screening for lipoid disorders: Secondary | ICD-10-CM | POA: Diagnosis not present

## 2023-09-25 DIAGNOSIS — Z0000 Encounter for general adult medical examination without abnormal findings: Secondary | ICD-10-CM | POA: Diagnosis not present

## 2023-09-27 DIAGNOSIS — N189 Chronic kidney disease, unspecified: Secondary | ICD-10-CM | POA: Diagnosis not present

## 2023-09-27 DIAGNOSIS — N2889 Other specified disorders of kidney and ureter: Secondary | ICD-10-CM | POA: Diagnosis not present

## 2023-09-27 DIAGNOSIS — N1330 Unspecified hydronephrosis: Secondary | ICD-10-CM | POA: Diagnosis not present

## 2023-10-15 DIAGNOSIS — N179 Acute kidney failure, unspecified: Secondary | ICD-10-CM | POA: Diagnosis not present

## 2023-12-11 DIAGNOSIS — M9902 Segmental and somatic dysfunction of thoracic region: Secondary | ICD-10-CM | POA: Diagnosis not present

## 2023-12-11 DIAGNOSIS — M9901 Segmental and somatic dysfunction of cervical region: Secondary | ICD-10-CM | POA: Diagnosis not present

## 2023-12-11 DIAGNOSIS — M50122 Cervical disc disorder at C5-C6 level with radiculopathy: Secondary | ICD-10-CM | POA: Diagnosis not present

## 2023-12-11 DIAGNOSIS — M5384 Other specified dorsopathies, thoracic region: Secondary | ICD-10-CM | POA: Diagnosis not present

## 2023-12-17 DIAGNOSIS — M9901 Segmental and somatic dysfunction of cervical region: Secondary | ICD-10-CM | POA: Diagnosis not present

## 2023-12-17 DIAGNOSIS — M9902 Segmental and somatic dysfunction of thoracic region: Secondary | ICD-10-CM | POA: Diagnosis not present

## 2023-12-17 DIAGNOSIS — M50122 Cervical disc disorder at C5-C6 level with radiculopathy: Secondary | ICD-10-CM | POA: Diagnosis not present

## 2023-12-17 DIAGNOSIS — M5384 Other specified dorsopathies, thoracic region: Secondary | ICD-10-CM | POA: Diagnosis not present

## 2023-12-17 DIAGNOSIS — M5032 Other cervical disc degeneration, mid-cervical region, unspecified level: Secondary | ICD-10-CM | POA: Diagnosis not present

## 2023-12-18 DIAGNOSIS — M9902 Segmental and somatic dysfunction of thoracic region: Secondary | ICD-10-CM | POA: Diagnosis not present

## 2023-12-18 DIAGNOSIS — M9901 Segmental and somatic dysfunction of cervical region: Secondary | ICD-10-CM | POA: Diagnosis not present

## 2023-12-18 DIAGNOSIS — M50122 Cervical disc disorder at C5-C6 level with radiculopathy: Secondary | ICD-10-CM | POA: Diagnosis not present

## 2023-12-18 DIAGNOSIS — M5384 Other specified dorsopathies, thoracic region: Secondary | ICD-10-CM | POA: Diagnosis not present

## 2023-12-20 DIAGNOSIS — M50122 Cervical disc disorder at C5-C6 level with radiculopathy: Secondary | ICD-10-CM | POA: Diagnosis not present

## 2023-12-20 DIAGNOSIS — M9901 Segmental and somatic dysfunction of cervical region: Secondary | ICD-10-CM | POA: Diagnosis not present

## 2023-12-20 DIAGNOSIS — M9902 Segmental and somatic dysfunction of thoracic region: Secondary | ICD-10-CM | POA: Diagnosis not present

## 2023-12-20 DIAGNOSIS — M5384 Other specified dorsopathies, thoracic region: Secondary | ICD-10-CM | POA: Diagnosis not present

## 2023-12-24 DIAGNOSIS — M9902 Segmental and somatic dysfunction of thoracic region: Secondary | ICD-10-CM | POA: Diagnosis not present

## 2023-12-24 DIAGNOSIS — M9901 Segmental and somatic dysfunction of cervical region: Secondary | ICD-10-CM | POA: Diagnosis not present

## 2023-12-24 DIAGNOSIS — M50122 Cervical disc disorder at C5-C6 level with radiculopathy: Secondary | ICD-10-CM | POA: Diagnosis not present

## 2023-12-24 DIAGNOSIS — M5384 Other specified dorsopathies, thoracic region: Secondary | ICD-10-CM | POA: Diagnosis not present

## 2023-12-25 DIAGNOSIS — M9901 Segmental and somatic dysfunction of cervical region: Secondary | ICD-10-CM | POA: Diagnosis not present

## 2023-12-25 DIAGNOSIS — M9902 Segmental and somatic dysfunction of thoracic region: Secondary | ICD-10-CM | POA: Diagnosis not present

## 2023-12-25 DIAGNOSIS — N179 Acute kidney failure, unspecified: Secondary | ICD-10-CM | POA: Diagnosis not present

## 2023-12-25 DIAGNOSIS — M50122 Cervical disc disorder at C5-C6 level with radiculopathy: Secondary | ICD-10-CM | POA: Diagnosis not present

## 2023-12-25 DIAGNOSIS — M5384 Other specified dorsopathies, thoracic region: Secondary | ICD-10-CM | POA: Diagnosis not present

## 2024-01-15 DIAGNOSIS — M9901 Segmental and somatic dysfunction of cervical region: Secondary | ICD-10-CM | POA: Diagnosis not present

## 2024-01-15 DIAGNOSIS — M9902 Segmental and somatic dysfunction of thoracic region: Secondary | ICD-10-CM | POA: Diagnosis not present

## 2024-01-15 DIAGNOSIS — M5384 Other specified dorsopathies, thoracic region: Secondary | ICD-10-CM | POA: Diagnosis not present

## 2024-01-15 DIAGNOSIS — M50122 Cervical disc disorder at C5-C6 level with radiculopathy: Secondary | ICD-10-CM | POA: Diagnosis not present

## 2024-01-17 DIAGNOSIS — M50122 Cervical disc disorder at C5-C6 level with radiculopathy: Secondary | ICD-10-CM | POA: Diagnosis not present

## 2024-01-17 DIAGNOSIS — M9901 Segmental and somatic dysfunction of cervical region: Secondary | ICD-10-CM | POA: Diagnosis not present

## 2024-01-17 DIAGNOSIS — M5384 Other specified dorsopathies, thoracic region: Secondary | ICD-10-CM | POA: Diagnosis not present

## 2024-01-17 DIAGNOSIS — M9902 Segmental and somatic dysfunction of thoracic region: Secondary | ICD-10-CM | POA: Diagnosis not present

## 2024-01-22 DIAGNOSIS — M9901 Segmental and somatic dysfunction of cervical region: Secondary | ICD-10-CM | POA: Diagnosis not present

## 2024-01-22 DIAGNOSIS — M9902 Segmental and somatic dysfunction of thoracic region: Secondary | ICD-10-CM | POA: Diagnosis not present

## 2024-01-22 DIAGNOSIS — M5384 Other specified dorsopathies, thoracic region: Secondary | ICD-10-CM | POA: Diagnosis not present

## 2024-01-22 DIAGNOSIS — M50122 Cervical disc disorder at C5-C6 level with radiculopathy: Secondary | ICD-10-CM | POA: Diagnosis not present

## 2024-01-24 DIAGNOSIS — M9902 Segmental and somatic dysfunction of thoracic region: Secondary | ICD-10-CM | POA: Diagnosis not present

## 2024-01-24 DIAGNOSIS — M5384 Other specified dorsopathies, thoracic region: Secondary | ICD-10-CM | POA: Diagnosis not present

## 2024-01-24 DIAGNOSIS — M9901 Segmental and somatic dysfunction of cervical region: Secondary | ICD-10-CM | POA: Diagnosis not present

## 2024-01-24 DIAGNOSIS — M50122 Cervical disc disorder at C5-C6 level with radiculopathy: Secondary | ICD-10-CM | POA: Diagnosis not present

## 2024-01-29 DIAGNOSIS — M50122 Cervical disc disorder at C5-C6 level with radiculopathy: Secondary | ICD-10-CM | POA: Diagnosis not present

## 2024-01-29 DIAGNOSIS — M5384 Other specified dorsopathies, thoracic region: Secondary | ICD-10-CM | POA: Diagnosis not present

## 2024-01-29 DIAGNOSIS — M9901 Segmental and somatic dysfunction of cervical region: Secondary | ICD-10-CM | POA: Diagnosis not present

## 2024-01-29 DIAGNOSIS — M9902 Segmental and somatic dysfunction of thoracic region: Secondary | ICD-10-CM | POA: Diagnosis not present

## 2024-02-13 DIAGNOSIS — N2889 Other specified disorders of kidney and ureter: Secondary | ICD-10-CM | POA: Diagnosis not present

## 2024-02-13 DIAGNOSIS — Z905 Acquired absence of kidney: Secondary | ICD-10-CM | POA: Diagnosis not present

## 2024-02-13 DIAGNOSIS — N179 Acute kidney failure, unspecified: Secondary | ICD-10-CM | POA: Diagnosis not present

## 2024-02-13 DIAGNOSIS — R944 Abnormal results of kidney function studies: Secondary | ICD-10-CM | POA: Diagnosis not present

## 2024-03-26 DIAGNOSIS — E559 Vitamin D deficiency, unspecified: Secondary | ICD-10-CM | POA: Diagnosis not present

## 2024-03-26 DIAGNOSIS — N2889 Other specified disorders of kidney and ureter: Secondary | ICD-10-CM | POA: Diagnosis not present

## 2024-03-26 DIAGNOSIS — R944 Abnormal results of kidney function studies: Secondary | ICD-10-CM | POA: Diagnosis not present

## 2024-04-02 DIAGNOSIS — N130 Hydronephrosis with ureteropelvic junction obstruction: Secondary | ICD-10-CM | POA: Diagnosis not present

## 2024-04-10 DIAGNOSIS — N130 Hydronephrosis with ureteropelvic junction obstruction: Secondary | ICD-10-CM | POA: Diagnosis not present

## 2024-06-03 DIAGNOSIS — M50122 Cervical disc disorder at C5-C6 level with radiculopathy: Secondary | ICD-10-CM | POA: Diagnosis not present

## 2024-06-03 DIAGNOSIS — M5384 Other specified dorsopathies, thoracic region: Secondary | ICD-10-CM | POA: Diagnosis not present

## 2024-06-03 DIAGNOSIS — M9901 Segmental and somatic dysfunction of cervical region: Secondary | ICD-10-CM | POA: Diagnosis not present

## 2024-06-03 DIAGNOSIS — M9902 Segmental and somatic dysfunction of thoracic region: Secondary | ICD-10-CM | POA: Diagnosis not present

## 2024-06-05 DIAGNOSIS — M9902 Segmental and somatic dysfunction of thoracic region: Secondary | ICD-10-CM | POA: Diagnosis not present

## 2024-06-05 DIAGNOSIS — M5384 Other specified dorsopathies, thoracic region: Secondary | ICD-10-CM | POA: Diagnosis not present

## 2024-06-05 DIAGNOSIS — M9901 Segmental and somatic dysfunction of cervical region: Secondary | ICD-10-CM | POA: Diagnosis not present

## 2024-06-05 DIAGNOSIS — M50122 Cervical disc disorder at C5-C6 level with radiculopathy: Secondary | ICD-10-CM | POA: Diagnosis not present

## 2024-06-11 DIAGNOSIS — M5384 Other specified dorsopathies, thoracic region: Secondary | ICD-10-CM | POA: Diagnosis not present

## 2024-06-11 DIAGNOSIS — M9901 Segmental and somatic dysfunction of cervical region: Secondary | ICD-10-CM | POA: Diagnosis not present

## 2024-06-11 DIAGNOSIS — M9902 Segmental and somatic dysfunction of thoracic region: Secondary | ICD-10-CM | POA: Diagnosis not present

## 2024-06-11 DIAGNOSIS — M50122 Cervical disc disorder at C5-C6 level with radiculopathy: Secondary | ICD-10-CM | POA: Diagnosis not present

## 2024-06-12 DIAGNOSIS — M5384 Other specified dorsopathies, thoracic region: Secondary | ICD-10-CM | POA: Diagnosis not present

## 2024-06-12 DIAGNOSIS — M9902 Segmental and somatic dysfunction of thoracic region: Secondary | ICD-10-CM | POA: Diagnosis not present

## 2024-06-12 DIAGNOSIS — M9901 Segmental and somatic dysfunction of cervical region: Secondary | ICD-10-CM | POA: Diagnosis not present

## 2024-06-12 DIAGNOSIS — M50122 Cervical disc disorder at C5-C6 level with radiculopathy: Secondary | ICD-10-CM | POA: Diagnosis not present

## 2024-06-17 DIAGNOSIS — M5384 Other specified dorsopathies, thoracic region: Secondary | ICD-10-CM | POA: Diagnosis not present

## 2024-06-17 DIAGNOSIS — M50122 Cervical disc disorder at C5-C6 level with radiculopathy: Secondary | ICD-10-CM | POA: Diagnosis not present

## 2024-06-17 DIAGNOSIS — M9901 Segmental and somatic dysfunction of cervical region: Secondary | ICD-10-CM | POA: Diagnosis not present

## 2024-06-17 DIAGNOSIS — M9902 Segmental and somatic dysfunction of thoracic region: Secondary | ICD-10-CM | POA: Diagnosis not present

## 2024-06-19 DIAGNOSIS — M50122 Cervical disc disorder at C5-C6 level with radiculopathy: Secondary | ICD-10-CM | POA: Diagnosis not present

## 2024-06-19 DIAGNOSIS — M5384 Other specified dorsopathies, thoracic region: Secondary | ICD-10-CM | POA: Diagnosis not present

## 2024-06-19 DIAGNOSIS — M9902 Segmental and somatic dysfunction of thoracic region: Secondary | ICD-10-CM | POA: Diagnosis not present

## 2024-06-19 DIAGNOSIS — M9901 Segmental and somatic dysfunction of cervical region: Secondary | ICD-10-CM | POA: Diagnosis not present

## 2024-06-24 DIAGNOSIS — M5384 Other specified dorsopathies, thoracic region: Secondary | ICD-10-CM | POA: Diagnosis not present

## 2024-06-24 DIAGNOSIS — M50122 Cervical disc disorder at C5-C6 level with radiculopathy: Secondary | ICD-10-CM | POA: Diagnosis not present

## 2024-06-24 DIAGNOSIS — M9901 Segmental and somatic dysfunction of cervical region: Secondary | ICD-10-CM | POA: Diagnosis not present

## 2024-06-24 DIAGNOSIS — M9902 Segmental and somatic dysfunction of thoracic region: Secondary | ICD-10-CM | POA: Diagnosis not present

## 2024-06-26 DIAGNOSIS — M50122 Cervical disc disorder at C5-C6 level with radiculopathy: Secondary | ICD-10-CM | POA: Diagnosis not present

## 2024-06-26 DIAGNOSIS — M9901 Segmental and somatic dysfunction of cervical region: Secondary | ICD-10-CM | POA: Diagnosis not present

## 2024-06-26 DIAGNOSIS — M5384 Other specified dorsopathies, thoracic region: Secondary | ICD-10-CM | POA: Diagnosis not present

## 2024-06-26 DIAGNOSIS — M9902 Segmental and somatic dysfunction of thoracic region: Secondary | ICD-10-CM | POA: Diagnosis not present

## 2024-08-14 DIAGNOSIS — E8729 Other acidosis: Secondary | ICD-10-CM | POA: Diagnosis not present

## 2024-08-14 DIAGNOSIS — N2889 Other specified disorders of kidney and ureter: Secondary | ICD-10-CM | POA: Diagnosis not present

## 2024-08-14 DIAGNOSIS — N179 Acute kidney failure, unspecified: Secondary | ICD-10-CM | POA: Diagnosis not present

## 2024-08-14 DIAGNOSIS — Z905 Acquired absence of kidney: Secondary | ICD-10-CM | POA: Diagnosis not present

## 2024-08-19 ENCOUNTER — Other Ambulatory Visit: Payer: Self-pay | Admitting: Nephrology

## 2024-08-19 DIAGNOSIS — Z87442 Personal history of urinary calculi: Secondary | ICD-10-CM

## 2024-08-19 DIAGNOSIS — N2889 Other specified disorders of kidney and ureter: Secondary | ICD-10-CM

## 2024-08-25 ENCOUNTER — Ambulatory Visit
Admission: RE | Admit: 2024-08-25 | Discharge: 2024-08-25 | Disposition: A | Discharge status: Home / Self Care | Source: Ambulatory Visit | Attending: Nephrology | Admitting: Nephrology

## 2024-08-25 DIAGNOSIS — N1330 Unspecified hydronephrosis: Secondary | ICD-10-CM | POA: Diagnosis not present

## 2024-08-25 DIAGNOSIS — Z87442 Personal history of urinary calculi: Secondary | ICD-10-CM

## 2024-08-25 DIAGNOSIS — N2889 Other specified disorders of kidney and ureter: Secondary | ICD-10-CM

## 2024-10-01 DIAGNOSIS — N1831 Chronic kidney disease, stage 3a: Secondary | ICD-10-CM | POA: Diagnosis not present

## 2024-10-01 DIAGNOSIS — Z1331 Encounter for screening for depression: Secondary | ICD-10-CM | POA: Diagnosis not present

## 2024-10-01 DIAGNOSIS — Z125 Encounter for screening for malignant neoplasm of prostate: Secondary | ICD-10-CM | POA: Diagnosis not present

## 2024-10-01 DIAGNOSIS — Z0000 Encounter for general adult medical examination without abnormal findings: Secondary | ICD-10-CM | POA: Diagnosis not present

## 2024-10-01 DIAGNOSIS — Z1211 Encounter for screening for malignant neoplasm of colon: Secondary | ICD-10-CM | POA: Diagnosis not present

## 2024-10-01 DIAGNOSIS — Z13220 Encounter for screening for lipoid disorders: Secondary | ICD-10-CM | POA: Diagnosis not present
